# Patient Record
Sex: Male | Born: 1990 | Race: Black or African American | Hispanic: No | Marital: Married | Smoking: Former smoker
Health system: Southern US, Community
[De-identification: ages and names within clinical notes are randomized; demographics above are authoritative.]

## PROBLEM LIST (undated history)

## (undated) DIAGNOSIS — J45909 Unspecified asthma, uncomplicated: Secondary | ICD-10-CM

## (undated) DIAGNOSIS — F32A Depression, unspecified: Secondary | ICD-10-CM

## (undated) DIAGNOSIS — F411 Generalized anxiety disorder: Secondary | ICD-10-CM

---

## 2019-05-03 ENCOUNTER — Encounter (HOSPITAL_COMMUNITY): Payer: Self-pay | Admitting: Emergency Medicine

## 2019-05-03 ENCOUNTER — Emergency Department (HOSPITAL_COMMUNITY)
Admission: EM | Admit: 2019-05-03 | Discharge: 2019-05-03 | Discharge status: Against Medical Advice | Payer: Self-pay | Attending: Emergency Medicine | Admitting: Emergency Medicine

## 2019-05-03 ENCOUNTER — Other Ambulatory Visit: Payer: Self-pay

## 2019-05-03 DIAGNOSIS — R42 Dizziness and giddiness: Secondary | ICD-10-CM | POA: Insufficient documentation

## 2019-05-03 DIAGNOSIS — Z5321 Procedure and treatment not carried out due to patient leaving prior to being seen by health care provider: Secondary | ICD-10-CM | POA: Insufficient documentation

## 2019-05-03 LAB — BASIC METABOLIC PANEL
Anion gap: 9 (ref 5–15)
BUN: 9 mg/dL (ref 6–20)
CO2: 25 mmol/L (ref 22–32)
Calcium: 10.1 mg/dL (ref 8.9–10.3)
Chloride: 104 mmol/L (ref 98–111)
Creatinine, Ser: 0.84 mg/dL (ref 0.61–1.24)
GFR calc Af Amer: >60 mL/min (ref >60)
GFR calc non Af Amer: >60 mL/min (ref >60)
Glucose, Bld: 107 mg/dL — ABNORMAL HIGH (ref 70–99)
Potassium: 3.8 mmol/L (ref 3.5–5.1)
Sodium: 138 mmol/L (ref 135–145)

## 2019-05-03 LAB — CBC WITH DIFFERENTIAL/PLATELET
Abs Immature Granulocytes: 0.04 10*3/uL (ref 0.00–0.07)
Basophils Absolute: 0 10*3/uL (ref 0.0–0.1)
Basophils Relative: 0 %
Eosinophils Absolute: 0 10*3/uL (ref 0.0–0.5)
Eosinophils Relative: 0 %
HCT: 45.2 % (ref 39.0–52.0)
Hemoglobin: 15.3 g/dL (ref 13.0–17.0)
Immature Granulocytes: 0 %
Lymphocytes Relative: 12 %
Lymphs Abs: 1.1 10*3/uL (ref 0.7–4.0)
MCH: 31.5 pg (ref 26.0–34.0)
MCHC: 33.8 g/dL (ref 30.0–36.0)
MCV: 93.2 fL (ref 80.0–100.0)
Monocytes Absolute: 0.4 10*3/uL (ref 0.1–1.0)
Monocytes Relative: 4 %
Neutro Abs: 7.5 10*3/uL (ref 1.7–7.7)
Neutrophils Relative %: 84 %
Platelets: 286 10*3/uL (ref 150–400)
RBC: 4.85 MIL/uL (ref 4.22–5.81)
RDW: 12.8 % (ref 11.5–15.5)
WBC: 9.1 10*3/uL (ref 4.0–10.5)
nRBC: 0 % (ref 0.0–0.2)

## 2019-05-03 NOTE — ED Notes (Signed)
Pt with no answer for ready room x3.

## 2019-05-03 NOTE — ED Triage Notes (Signed)
Patient reports worsening migraine headache with dizziness and emesis onset last month unrelieved by OTC medications , denies head injury , no fever or chills .

## 2019-11-28 ENCOUNTER — Inpatient Hospital Stay (HOSPITAL_COMMUNITY)
Admission: EM | Admit: 2019-11-28 | Discharge: 2019-12-07 | DRG: 026 | Disposition: A | Discharge status: Home / Self Care | Payer: BC Managed Care – PPO | Attending: Neurosurgery | Admitting: Neurosurgery

## 2019-11-28 ENCOUNTER — Encounter (HOSPITAL_COMMUNITY): Payer: Self-pay | Admitting: Emergency Medicine

## 2019-11-28 ENCOUNTER — Other Ambulatory Visit: Payer: Self-pay

## 2019-11-28 DIAGNOSIS — G911 Obstructive hydrocephalus: Secondary | ICD-10-CM

## 2019-11-28 DIAGNOSIS — D431 Neoplasm of uncertain behavior of brain, infratentorial: Principal | ICD-10-CM | POA: Diagnosis present

## 2019-11-28 DIAGNOSIS — C716 Malignant neoplasm of cerebellum: Secondary | ICD-10-CM | POA: Diagnosis present

## 2019-11-28 DIAGNOSIS — D497 Neoplasm of unspecified behavior of endocrine glands and other parts of nervous system: Secondary | ICD-10-CM | POA: Diagnosis present

## 2019-11-28 DIAGNOSIS — Z20822 Contact with and (suspected) exposure to covid-19: Secondary | ICD-10-CM | POA: Diagnosis present

## 2019-11-28 DIAGNOSIS — R519 Headache, unspecified: Secondary | ICD-10-CM | POA: Diagnosis not present

## 2019-11-28 DIAGNOSIS — G9389 Other specified disorders of brain: Secondary | ICD-10-CM

## 2019-11-28 DIAGNOSIS — F17200 Nicotine dependence, unspecified, uncomplicated: Secondary | ICD-10-CM | POA: Diagnosis present

## 2019-11-28 LAB — DIFFERENTIAL
Abs Immature Granulocytes: 0.03 10*3/uL (ref 0.00–0.07)
Basophils Absolute: 0 10*3/uL (ref 0.0–0.1)
Basophils Relative: 1 %
Eosinophils Absolute: 0.1 10*3/uL (ref 0.0–0.5)
Eosinophils Relative: 1 %
Immature Granulocytes: 0 %
Lymphocytes Relative: 22 %
Lymphs Abs: 1.5 10*3/uL (ref 0.7–4.0)
Monocytes Absolute: 0.4 10*3/uL (ref 0.1–1.0)
Monocytes Relative: 7 %
Neutro Abs: 4.7 10*3/uL (ref 1.7–7.7)
Neutrophils Relative %: 69 %

## 2019-11-28 LAB — CBC
HCT: 46.7 % (ref 39.0–52.0)
Hemoglobin: 15.5 g/dL (ref 13.0–17.0)
MCH: 31.1 pg (ref 26.0–34.0)
MCHC: 33.2 g/dL (ref 30.0–36.0)
MCV: 93.6 fL (ref 80.0–100.0)
Platelets: 319 10*3/uL (ref 150–400)
RBC: 4.99 MIL/uL (ref 4.22–5.81)
RDW: 12.7 % (ref 11.5–15.5)
WBC: 6.8 10*3/uL (ref 4.0–10.5)
nRBC: 0 % (ref 0.0–0.2)

## 2019-11-28 LAB — I-STAT CHEM 8, ED
BUN: 10 mg/dL (ref 6–20)
Calcium, Ion: 1.23 mmol/L (ref 1.15–1.40)
Chloride: 103 mmol/L (ref 98–111)
Creatinine, Ser: 0.9 mg/dL (ref 0.61–1.24)
Glucose, Bld: 88 mg/dL (ref 70–99)
HCT: 46 % (ref 39.0–52.0)
Hemoglobin: 15.6 g/dL (ref 13.0–17.0)
Potassium: 4 mmol/L (ref 3.5–5.1)
Sodium: 141 mmol/L (ref 135–145)
TCO2: 29 mmol/L (ref 22–32)

## 2019-11-28 LAB — PROTIME-INR
INR: 1.1 (ref 0.8–1.2)
Prothrombin Time: 13.8 seconds (ref 11.4–15.2)

## 2019-11-28 LAB — APTT: aPTT: 28 seconds (ref 24–36)

## 2019-11-28 NOTE — ED Triage Notes (Signed)
Pt reports hx of migranes X3 weeks as well as vision changes over the past three weeks.  Pt did have problems w/ coordination.  PA Harris verbally ordered CT.

## 2019-11-29 ENCOUNTER — Encounter (HOSPITAL_COMMUNITY)
Admission: EM | Disposition: A | Discharge status: Home / Self Care | Payer: Self-pay | Source: Home / Self Care | Attending: Neurosurgery

## 2019-11-29 ENCOUNTER — Inpatient Hospital Stay (HOSPITAL_COMMUNITY): Payer: BC Managed Care – PPO | Admitting: Anesthesiology

## 2019-11-29 ENCOUNTER — Emergency Department (HOSPITAL_COMMUNITY): Payer: BC Managed Care – PPO

## 2019-11-29 ENCOUNTER — Encounter (HOSPITAL_COMMUNITY): Payer: Self-pay | Admitting: Neurosurgery

## 2019-11-29 ENCOUNTER — Inpatient Hospital Stay (HOSPITAL_COMMUNITY): Payer: BC Managed Care – PPO

## 2019-11-29 DIAGNOSIS — Z20822 Contact with and (suspected) exposure to covid-19: Secondary | ICD-10-CM | POA: Diagnosis present

## 2019-11-29 DIAGNOSIS — G911 Obstructive hydrocephalus: Secondary | ICD-10-CM | POA: Diagnosis present

## 2019-11-29 DIAGNOSIS — R519 Headache, unspecified: Secondary | ICD-10-CM | POA: Diagnosis present

## 2019-11-29 DIAGNOSIS — F17200 Nicotine dependence, unspecified, uncomplicated: Secondary | ICD-10-CM | POA: Diagnosis present

## 2019-11-29 DIAGNOSIS — D497 Neoplasm of unspecified behavior of endocrine glands and other parts of nervous system: Secondary | ICD-10-CM | POA: Diagnosis present

## 2019-11-29 DIAGNOSIS — C716 Malignant neoplasm of cerebellum: Secondary | ICD-10-CM | POA: Diagnosis present

## 2019-11-29 DIAGNOSIS — D431 Neoplasm of uncertain behavior of brain, infratentorial: Secondary | ICD-10-CM | POA: Diagnosis present

## 2019-11-29 HISTORY — PX: SUBOCCIPITAL CRANIECTOMY CERVICAL LAMINECTOMY: SHX5404

## 2019-11-29 LAB — RESPIRATORY PANEL BY RT PCR (FLU A&B, COVID)
Influenza A by PCR: NEGATIVE
Influenza B by PCR: NEGATIVE
SARS Coronavirus 2 by RT PCR: NEGATIVE

## 2019-11-29 LAB — CBC WITH DIFFERENTIAL/PLATELET
Abs Immature Granulocytes: 0.02 10*3/uL (ref 0.00–0.07)
Basophils Absolute: 0 10*3/uL (ref 0.0–0.1)
Basophils Relative: 1 %
Eosinophils Absolute: 0.1 10*3/uL (ref 0.0–0.5)
Eosinophils Relative: 2 %
HCT: 43.3 % (ref 39.0–52.0)
Hemoglobin: 14.5 g/dL (ref 13.0–17.0)
Immature Granulocytes: 0 %
Lymphocytes Relative: 32 %
Lymphs Abs: 2.1 10*3/uL (ref 0.7–4.0)
MCH: 31.8 pg (ref 26.0–34.0)
MCHC: 33.5 g/dL (ref 30.0–36.0)
MCV: 95 fL (ref 80.0–100.0)
Monocytes Absolute: 0.6 10*3/uL (ref 0.1–1.0)
Monocytes Relative: 9 %
Neutro Abs: 3.7 10*3/uL (ref 1.7–7.7)
Neutrophils Relative %: 56 %
Platelets: 315 10*3/uL (ref 150–400)
RBC: 4.56 MIL/uL (ref 4.22–5.81)
RDW: 12.9 % (ref 11.5–15.5)
WBC: 6.5 10*3/uL (ref 4.0–10.5)
nRBC: 0 % (ref 0.0–0.2)

## 2019-11-29 LAB — COMPREHENSIVE METABOLIC PANEL
ALT: 13 U/L (ref 0–44)
AST: 15 U/L (ref 15–41)
Albumin: 4.4 g/dL (ref 3.5–5.0)
Alkaline Phosphatase: 68 U/L (ref 38–126)
Anion gap: 12 (ref 5–15)
BUN: 10 mg/dL (ref 6–20)
CO2: 27 mmol/L (ref 22–32)
Calcium: 10.2 mg/dL (ref 8.9–10.3)
Chloride: 103 mmol/L (ref 98–111)
Creatinine, Ser: 0.92 mg/dL (ref 0.61–1.24)
GFR calc Af Amer: >60 mL/min (ref >60)
GFR calc non Af Amer: >60 mL/min (ref >60)
Glucose, Bld: 97 mg/dL (ref 70–99)
Potassium: 4.1 mmol/L (ref 3.5–5.1)
Sodium: 142 mmol/L (ref 135–145)
Total Bilirubin: 1 mg/dL (ref 0.3–1.2)
Total Protein: 7.6 g/dL (ref 6.5–8.1)

## 2019-11-29 LAB — APTT: aPTT: 28 seconds (ref 24–36)

## 2019-11-29 LAB — SURGICAL PCR SCREEN
MRSA, PCR: POSITIVE — AB
Staphylococcus aureus: POSITIVE — AB

## 2019-11-29 LAB — TYPE AND SCREEN
ABO/RH(D): O POS
Antibody Screen: NEGATIVE

## 2019-11-29 LAB — HIV ANTIBODY (ROUTINE TESTING W REFLEX): HIV Screen 4th Generation wRfx: NONREACTIVE

## 2019-11-29 LAB — CREATININE, SERUM
Creatinine, Ser: 0.83 mg/dL (ref 0.61–1.24)
GFR calc Af Amer: >60 mL/min (ref >60)
GFR calc non Af Amer: >60 mL/min (ref >60)

## 2019-11-29 LAB — PROTIME-INR
INR: 1.1 (ref 0.8–1.2)
Prothrombin Time: 13.5 seconds (ref 11.4–15.2)

## 2019-11-29 LAB — ABO/RH: ABO/RH(D): O POS

## 2019-11-29 SURGERY — SUBOCCIPITAL CRANIECTOMY CERVICAL LAMINECTOMY/DURAPLASTY
Anesthesia: General | Site: Neck

## 2019-11-29 MED ORDER — FENTANYL CITRATE (PF) 100 MCG/2ML IJ SOLN
INTRAMUSCULAR | Status: AC
  Filled 2019-11-29: qty 2

## 2019-11-29 MED ORDER — SUCCINYLCHOLINE CHLORIDE 200 MG/10ML IV SOSY
PREFILLED_SYRINGE | INTRAVENOUS | Status: AC
  Filled 2019-11-29: qty 10

## 2019-11-29 MED ORDER — MUPIROCIN 2 % EX OINT
1.0000 "application " | TOPICAL_OINTMENT | Freq: Two times a day (BID) | CUTANEOUS | Status: AC
  Administered 2019-11-29 – 2019-12-03 (×10): 1 via NASAL
  Filled 2019-11-29 (×3): qty 22

## 2019-11-29 MED ORDER — MORPHINE SULFATE (PF) 2 MG/ML IV SOLN
2.0000 mg | INTRAVENOUS | Status: DC | PRN
  Administered 2019-11-29 – 2019-12-06 (×24): 2 mg via INTRAVENOUS
  Filled 2019-11-29 (×24): qty 1

## 2019-11-29 MED ORDER — 0.9 % SODIUM CHLORIDE (POUR BTL) OPTIME
TOPICAL | Status: DC | PRN
  Administered 2019-11-29 (×3): 1000 mL

## 2019-11-29 MED ORDER — SODIUM CHLORIDE 0.9 % IV SOLN: INTRAVENOUS | Status: DC | PRN

## 2019-11-29 MED ORDER — CHLORHEXIDINE GLUCONATE CLOTH 2 % EX PADS
6.0000 | MEDICATED_PAD | Freq: Every day | CUTANEOUS | Status: DC
Start: 2019-11-29 — End: 2019-11-29

## 2019-11-29 MED ORDER — FENTANYL CITRATE (PF) 250 MCG/5ML IJ SOLN
INTRAMUSCULAR | Status: AC
  Filled 2019-11-29: qty 5

## 2019-11-29 MED ORDER — MENTHOL 3 MG MT LOZG
1.0000 | LOZENGE | OROMUCOSAL | Status: DC | PRN
  Administered 2019-11-29 – 2019-11-30 (×3): 3 mg via ORAL
  Filled 2019-11-29: qty 9

## 2019-11-29 MED ORDER — NALOXONE HCL 0.4 MG/ML IJ SOLN: 0.0800 mg | INTRAMUSCULAR | Status: DC | PRN

## 2019-11-29 MED ORDER — ACETAMINOPHEN 650 MG RE SUPP: 650.0000 mg | Freq: Four times a day (QID) | RECTAL | Status: DC | PRN

## 2019-11-29 MED ORDER — ONDANSETRON HCL 4 MG PO TABS: 4.0000 mg | ORAL_TABLET | Freq: Four times a day (QID) | ORAL | Status: DC | PRN

## 2019-11-29 MED ORDER — SODIUM CHLORIDE 0.9 % IV SOLN
INTRAVENOUS | Status: AC | PRN
  Administered 2019-11-29: 1000 mL via INTRAMUSCULAR

## 2019-11-29 MED ORDER — HEMOSTATIC AGENTS (NO CHARGE) OPTIME
TOPICAL | Status: DC | PRN
  Administered 2019-11-29: 1 via TOPICAL

## 2019-11-29 MED ORDER — THROMBIN 5000 UNITS EX SOLR
CUTANEOUS | Status: AC
  Filled 2019-11-29: qty 5000

## 2019-11-29 MED ORDER — CEFAZOLIN SODIUM-DEXTROSE 2-3 GM-%(50ML) IV SOLR
INTRAVENOUS | Status: DC | PRN
  Administered 2019-11-29: 2 g via INTRAVENOUS

## 2019-11-29 MED ORDER — SENNOSIDES-DOCUSATE SODIUM 8.6-50 MG PO TABS
1.0000 | ORAL_TABLET | Freq: Every evening | ORAL | Status: DC | PRN
  Administered 2019-12-04: 1 via ORAL
  Filled 2019-11-29: qty 1

## 2019-11-29 MED ORDER — THROMBIN 20000 UNITS EX SOLR
CUTANEOUS | Status: AC
  Filled 2019-11-29: qty 20000

## 2019-11-29 MED ORDER — LEVETIRACETAM IN NACL 500 MG/100ML IV SOLN
500.0000 mg | Freq: Two times a day (BID) | INTRAVENOUS | Status: DC
  Administered 2019-11-29 – 2019-12-01 (×4): 500 mg via INTRAVENOUS
  Filled 2019-11-29 (×4): qty 100

## 2019-11-29 MED ORDER — PROMETHAZINE HCL 25 MG/ML IJ SOLN
12.5000 mg | Freq: Four times a day (QID) | INTRAMUSCULAR | Status: DC | PRN
  Filled 2019-11-29: qty 1

## 2019-11-29 MED ORDER — PROMETHAZINE HCL 25 MG PO TABS: 12.5000 mg | ORAL_TABLET | ORAL | Status: DC | PRN

## 2019-11-29 MED ORDER — ROCURONIUM BROMIDE 10 MG/ML (PF) SYRINGE
PREFILLED_SYRINGE | INTRAVENOUS | Status: DC | PRN
  Administered 2019-11-29: 50 mg via INTRAVENOUS
  Administered 2019-11-29: 100 mg via INTRAVENOUS

## 2019-11-29 MED ORDER — FENTANYL CITRATE (PF) 250 MCG/5ML IJ SOLN
INTRAMUSCULAR | Status: DC | PRN
  Administered 2019-11-29 (×3): 50 ug via INTRAVENOUS
  Administered 2019-11-29: 150 ug via INTRAVENOUS

## 2019-11-29 MED ORDER — CHLORHEXIDINE GLUCONATE CLOTH 2 % EX PADS
6.0000 | MEDICATED_PAD | Freq: Every day | CUTANEOUS | Status: DC
  Administered 2019-11-29: 6 via TOPICAL

## 2019-11-29 MED ORDER — ONDANSETRON HCL 4 MG/2ML IJ SOLN
INTRAMUSCULAR | Status: DC | PRN
  Administered 2019-11-29: 4 mg via INTRAVENOUS

## 2019-11-29 MED ORDER — CEFAZOLIN SODIUM-DEXTROSE 1-4 GM/50ML-% IV SOLN
1.0000 g | Freq: Three times a day (TID) | INTRAVENOUS | Status: DC
  Administered 2019-11-29 – 2019-12-07 (×24): 1 g via INTRAVENOUS
  Filled 2019-11-29 (×26): qty 50

## 2019-11-29 MED ORDER — LIDOCAINE-EPINEPHRINE 0.5 %-1:200000 IJ SOLN
INTRAMUSCULAR | Status: AC
  Filled 2019-11-29: qty 1

## 2019-11-29 MED ORDER — BACITRACIN ZINC 500 UNIT/GM EX OINT
TOPICAL_OINTMENT | CUTANEOUS | Status: AC
  Filled 2019-11-29: qty 28.35

## 2019-11-29 MED ORDER — DEXMEDETOMIDINE HCL 200 MCG/2ML IV SOLN
INTRAVENOUS | Status: DC | PRN
  Administered 2019-11-29 (×2): 20 ug via INTRAVENOUS

## 2019-11-29 MED ORDER — SODIUM CHLORIDE 0.9 % IV SOLN
250.0000 mL | INTRAVENOUS | Status: DC | PRN
  Administered 2019-12-06: 250 mL via INTRAVENOUS

## 2019-11-29 MED ORDER — ACETAMINOPHEN 325 MG PO TABS
650.0000 mg | ORAL_TABLET | Freq: Four times a day (QID) | ORAL | Status: DC | PRN
  Administered 2019-11-30 – 2019-12-01 (×2): 650 mg via ORAL
  Filled 2019-11-29 (×3): qty 2

## 2019-11-29 MED ORDER — FLEET ENEMA 7-19 GM/118ML RE ENEM: 1.0000 | ENEMA | Freq: Once | RECTAL | Status: DC | PRN

## 2019-11-29 MED ORDER — SODIUM CHLORIDE 0.9% FLUSH
3.0000 mL | Freq: Two times a day (BID) | INTRAVENOUS | Status: DC
  Administered 2019-11-29 – 2019-12-07 (×15): 3 mL via INTRAVENOUS

## 2019-11-29 MED ORDER — HYDROCODONE-ACETAMINOPHEN 5-325 MG PO TABS
1.0000 | ORAL_TABLET | ORAL | Status: DC | PRN
  Administered 2019-11-30 (×3): 1 via ORAL
  Administered 2019-12-01 (×2): 2 via ORAL
  Administered 2019-12-01: 1 via ORAL
  Administered 2019-12-01: 2 via ORAL
  Administered 2019-12-02 (×2): 1 via ORAL
  Administered 2019-12-03 – 2019-12-07 (×21): 2 via ORAL
  Filled 2019-11-29 (×2): qty 1
  Filled 2019-11-29 (×4): qty 2
  Filled 2019-11-29: qty 1
  Filled 2019-11-29 (×2): qty 2
  Filled 2019-11-29: qty 1
  Filled 2019-11-29 (×10): qty 2
  Filled 2019-11-29: qty 1
  Filled 2019-11-29 (×3): qty 2
  Filled 2019-11-29: qty 1
  Filled 2019-11-29 (×6): qty 2

## 2019-11-29 MED ORDER — IOHEXOL 300 MG/ML  SOLN
75.0000 mL | Freq: Once | INTRAMUSCULAR | Status: AC | PRN
  Administered 2019-11-29: 13:00:00 75 mL via INTRAVENOUS

## 2019-11-29 MED ORDER — ONDANSETRON HCL 4 MG/2ML IJ SOLN
4.0000 mg | Freq: Four times a day (QID) | INTRAMUSCULAR | Status: DC | PRN
  Administered 2019-11-29 – 2019-12-02 (×5): 4 mg via INTRAVENOUS
  Filled 2019-11-29 (×5): qty 2

## 2019-11-29 MED ORDER — PHENYLEPHRINE HCL-NACL 10-0.9 MG/250ML-% IV SOLN
INTRAVENOUS | Status: DC | PRN
  Administered 2019-11-29: 15 ug/min via INTRAVENOUS

## 2019-11-29 MED ORDER — PROPOFOL 10 MG/ML IV BOLUS
INTRAVENOUS | Status: DC | PRN
  Administered 2019-11-29: 140 mg via INTRAVENOUS
  Administered 2019-11-29 (×2): 40 mg via INTRAVENOUS
  Administered 2019-11-29: 60 mg via INTRAVENOUS

## 2019-11-29 MED ORDER — LIDOCAINE 2% (20 MG/ML) 5 ML SYRINGE
INTRAMUSCULAR | Status: AC
  Filled 2019-11-29: qty 5

## 2019-11-29 MED ORDER — POTASSIUM CHLORIDE IN NACL 20-0.9 MEQ/L-% IV SOLN
INTRAVENOUS | Status: DC
  Filled 2019-11-29 (×3): qty 1000

## 2019-11-29 MED ORDER — THROMBIN 20000 UNITS EX SOLR: CUTANEOUS | Status: DC | PRN

## 2019-11-29 MED ORDER — LABETALOL HCL 5 MG/ML IV SOLN: 10.0000 mg | INTRAVENOUS | Status: DC | PRN

## 2019-11-29 MED ORDER — HEPARIN SODIUM (PORCINE) 5000 UNIT/ML IJ SOLN
5000.0000 [IU] | Freq: Three times a day (TID) | INTRAMUSCULAR | Status: DC
  Administered 2019-11-30 – 2019-12-07 (×23): 5000 [IU] via SUBCUTANEOUS
  Filled 2019-11-29 (×23): qty 1

## 2019-11-29 MED ORDER — DEXAMETHASONE SODIUM PHOSPHATE 10 MG/ML IJ SOLN
6.0000 mg | Freq: Four times a day (QID) | INTRAMUSCULAR | Status: DC
  Administered 2019-11-29 (×2): 6 mg via INTRAVENOUS
  Administered 2019-11-29: 10 mg via INTRAVENOUS
  Administered 2019-11-30 (×4): 6 mg via INTRAVENOUS
  Filled 2019-11-29 (×7): qty 1

## 2019-11-29 MED ORDER — PANTOPRAZOLE SODIUM 40 MG IV SOLR
40.0000 mg | Freq: Every day | INTRAVENOUS | Status: DC
  Administered 2019-11-30: 22:00:00 40 mg via INTRAVENOUS
  Filled 2019-11-29 (×2): qty 40

## 2019-11-29 MED ORDER — ONDANSETRON HCL 4 MG/2ML IJ SOLN
INTRAMUSCULAR | Status: AC
  Filled 2019-11-29: qty 2

## 2019-11-29 MED ORDER — SUGAMMADEX SODIUM 200 MG/2ML IV SOLN
INTRAVENOUS | Status: DC | PRN
  Administered 2019-11-29: 400 mg via INTRAVENOUS

## 2019-11-29 MED ORDER — SODIUM CHLORIDE 0.9 % IV SOLN
0.3000 ug/kg/min | INTRAVENOUS | Status: DC
  Filled 2019-11-29 (×5): qty 5000

## 2019-11-29 MED ORDER — GADOBUTROL 1 MMOL/ML IV SOLN
9.7000 mL | Freq: Once | INTRAVENOUS | Status: AC | PRN
  Administered 2019-11-29: 03:00:00 9.7 mL via INTRAVENOUS

## 2019-11-29 MED ORDER — MANNITOL 25 % IV SOLN
INTRAVENOUS | Status: DC | PRN
  Administered 2019-11-29: 40.5 g via INTRAVENOUS

## 2019-11-29 MED ORDER — SODIUM CHLORIDE 0.9% FLUSH: 3.0000 mL | INTRAVENOUS | Status: DC | PRN

## 2019-11-29 MED ORDER — THROMBIN 5000 UNITS EX SOLR: OROMUCOSAL | Status: DC | PRN

## 2019-11-29 MED ORDER — PROPOFOL 10 MG/ML IV BOLUS
INTRAVENOUS | Status: AC
  Filled 2019-11-29: qty 20

## 2019-11-29 MED ORDER — DEXAMETHASONE SODIUM PHOSPHATE 10 MG/ML IJ SOLN
10.0000 mg | Freq: Once | INTRAMUSCULAR | Status: AC
  Administered 2019-11-29: 05:00:00 10 mg via INTRAVENOUS
  Filled 2019-11-29: qty 1

## 2019-11-29 MED ORDER — SODIUM CHLORIDE 0.9 % IV SOLN: INTRAVENOUS | Status: DC

## 2019-11-29 MED ORDER — LIDOCAINE 2% (20 MG/ML) 5 ML SYRINGE
INTRAMUSCULAR | Status: DC | PRN
  Administered 2019-11-29: 60 mg via INTRAVENOUS

## 2019-11-29 MED ORDER — FENTANYL CITRATE (PF) 100 MCG/2ML IJ SOLN
25.0000 ug | INTRAMUSCULAR | Status: DC | PRN
  Administered 2019-11-29: 50 ug via INTRAVENOUS

## 2019-11-29 MED ORDER — BACITRACIN ZINC 500 UNIT/GM EX OINT
TOPICAL_OINTMENT | CUTANEOUS | Status: DC | PRN
  Administered 2019-11-29 (×2): 1 via TOPICAL

## 2019-11-29 MED ORDER — BISACODYL 5 MG PO TBEC
5.0000 mg | DELAYED_RELEASE_TABLET | Freq: Every day | ORAL | Status: DC | PRN
  Administered 2019-12-04 – 2019-12-07 (×3): 5 mg via ORAL
  Filled 2019-11-29 (×3): qty 1

## 2019-11-29 MED ORDER — CEFAZOLIN SODIUM-DEXTROSE 2-4 GM/100ML-% IV SOLN
INTRAVENOUS | Status: AC
  Filled 2019-11-29: qty 100

## 2019-11-29 MED ORDER — ESMOLOL HCL 100 MG/10ML IV SOLN
INTRAVENOUS | Status: DC | PRN
  Administered 2019-11-29: 20 mg via INTRAVENOUS
  Administered 2019-11-29: 70 mg via INTRAVENOUS
  Administered 2019-11-29: 20 mg via INTRAVENOUS
  Administered 2019-11-29: 40 mg via INTRAVENOUS
  Administered 2019-11-29: 30 mg via INTRAVENOUS
  Administered 2019-11-29: 40 mg via INTRAVENOUS

## 2019-11-29 MED ORDER — ESMOLOL HCL 100 MG/10ML IV SOLN
INTRAVENOUS | Status: AC
  Filled 2019-11-29: qty 20

## 2019-11-29 MED ORDER — DEXAMETHASONE SODIUM PHOSPHATE 10 MG/ML IJ SOLN
INTRAMUSCULAR | Status: AC
  Filled 2019-11-29: qty 1

## 2019-11-29 MED ORDER — SODIUM CHLORIDE 0.9 % IV SOLN
0.0125 ug/kg/min | INTRAVENOUS | Status: AC
  Administered 2019-11-29: .3 ug/kg/min via INTRAVENOUS
  Administered 2019-11-29: .2 ug/kg/min via INTRAVENOUS
  Filled 2019-11-29: qty 2000

## 2019-11-29 MED ORDER — LIDOCAINE-EPINEPHRINE 0.5 %-1:200000 IJ SOLN
INTRAMUSCULAR | Status: DC | PRN
  Administered 2019-11-29: 10 mL

## 2019-11-29 MED ORDER — POTASSIUM CHLORIDE IN NACL 20-0.9 MEQ/L-% IV SOLN
INTRAVENOUS | Status: DC
  Filled 2019-11-29 (×2): qty 1000

## 2019-11-29 SURGICAL SUPPLY — 104 items
BENZOIN TINCTURE PRP APPL 2/3 (GAUZE/BANDAGES/DRESSINGS) IMPLANT
BLADE CLIPPER SURG (BLADE) ×3 IMPLANT
BLADE ULTRA TIP 2M (BLADE) IMPLANT
BUR ACORN 6.0 PRECISION (BURR) IMPLANT
BUR ACORN 6.0MM PRECISION (BURR)
BUR ACORN 9.0 PRECISION (BURR) ×2 IMPLANT
BUR ACORN 9.0MM PRECISION (BURR) ×1
BUR SPIRAL ROUTER 2.3 (BUR) ×2 IMPLANT
BUR SPIRAL ROUTER 2.3MM (BUR) ×1
BUR TAPERED ROUTER 3.0 (BURR) IMPLANT
CABLE BIPOLOR RESECTION CORD (MISCELLANEOUS) IMPLANT
CANISTER SUCT 3000ML PPV (MISCELLANEOUS) ×6 IMPLANT
CARTRIDGE OIL MAESTRO DRILL (MISCELLANEOUS) IMPLANT
CATH VENTRIC 35X38 W/TROCAR LG (CATHETERS) ×3 IMPLANT
CATH VENTRICULAR ANTIMICRO (CATHETERS) IMPLANT
CATHETER VENTRICULAR ANTIMICRO (CATHETERS)
CLIP VESOCCLUDE MED 6/CT (CLIP) ×6 IMPLANT
COVER BACK TABLE 60X90IN (DRAPES) IMPLANT
COVER BURR HOLE UNIV 10 (Orthopedic Implant) ×6 IMPLANT
COVER WAND RF STERILE (DRAPES) IMPLANT
DECANTER SPIKE VIAL GLASS SM (MISCELLANEOUS) ×3 IMPLANT
DIFFUSER DRILL AIR PNEUMATIC (MISCELLANEOUS) IMPLANT
DRAPE LAPAROTOMY 100X72 PEDS (DRAPES) ×3 IMPLANT
DRAPE MICROSCOPE LEICA (MISCELLANEOUS) ×3 IMPLANT
DRAPE WARM FLUID 44X44 (DRAPES) ×3 IMPLANT
DRSG OPSITE 4X5.5 SM (GAUZE/BANDAGES/DRESSINGS) ×3 IMPLANT
DRSG OPSITE POSTOP 3X4 (GAUZE/BANDAGES/DRESSINGS) ×3 IMPLANT
DRSG OPSITE POSTOP 4X6 (GAUZE/BANDAGES/DRESSINGS) ×3 IMPLANT
DURAGUARD 04CMX04CM ×3 IMPLANT
DURAPREP 26ML APPLICATOR (WOUND CARE) ×3 IMPLANT
DURAPREP 6ML APPLICATOR 50/CS (WOUND CARE) IMPLANT
ELECT CAUTERY BLADE 6.4 (BLADE) IMPLANT
ELECT REM PT RETURN 9FT ADLT (ELECTROSURGICAL) ×3
ELECTRODE REM PT RTRN 9FT ADLT (ELECTROSURGICAL) ×1 IMPLANT
EVACUATOR 1/8 PVC DRAIN (DRAIN) IMPLANT
EVACUATOR SILICONE 100CC (DRAIN) IMPLANT
FORCEPS BIPO MALIS IRRIG 9X1.5 (NEUROSURGERY SUPPLIES) ×6 IMPLANT
FORCEPS BIPOLAR SPETZLER 8 1.0 (NEUROSURGERY SUPPLIES) IMPLANT
GAUZE 4X4 16PLY RFD (DISPOSABLE) IMPLANT
GAUZE SPONGE 4X4 12PLY STRL (GAUZE/BANDAGES/DRESSINGS) IMPLANT
GLOVE BIO SURGEON STRL SZ 6.5 (GLOVE) ×4 IMPLANT
GLOVE BIO SURGEON STRL SZ7 (GLOVE) ×3 IMPLANT
GLOVE BIO SURGEON STRL SZ7.5 (GLOVE) ×3 IMPLANT
GLOVE BIO SURGEON STRL SZ8 (GLOVE) IMPLANT
GLOVE BIO SURGEON STRL SZ8.5 (GLOVE) IMPLANT
GLOVE BIO SURGEONS STRL SZ 6.5 (GLOVE) ×2
GLOVE BIOGEL M 8.0 STRL (GLOVE) IMPLANT
GLOVE BIOGEL PI IND STRL 6.5 (GLOVE) ×1 IMPLANT
GLOVE BIOGEL PI IND STRL 7.0 (GLOVE) ×1 IMPLANT
GLOVE BIOGEL PI INDICATOR 6.5 (GLOVE) ×2
GLOVE BIOGEL PI INDICATOR 7.0 (GLOVE) ×2
GLOVE ECLIPSE 6.5 STRL STRAW (GLOVE) ×3 IMPLANT
GLOVE ECLIPSE 7.0 STRL STRAW (GLOVE) IMPLANT
GLOVE ECLIPSE 7.5 STRL STRAW (GLOVE) ×6 IMPLANT
GLOVE ECLIPSE 8.0 STRL XLNG CF (GLOVE) IMPLANT
GLOVE ECLIPSE 8.5 STRL (GLOVE) IMPLANT
GLOVE EXAM NITRILE XL STR (GLOVE) IMPLANT
GLOVE INDICATOR 6.5 STRL GRN (GLOVE) ×3 IMPLANT
GLOVE INDICATOR 7.0 STRL GRN (GLOVE) ×3 IMPLANT
GLOVE INDICATOR 7.5 STRL GRN (GLOVE) ×12 IMPLANT
GLOVE INDICATOR 8.0 STRL GRN (GLOVE) IMPLANT
GLOVE INDICATOR 8.5 STRL (GLOVE) IMPLANT
GLOVE OPTIFIT SS 8.0 STRL (GLOVE) IMPLANT
GLOVE SURG SS PI 6.5 STRL IVOR (GLOVE) ×3 IMPLANT
GLOVE SURG SS PI 7.0 STRL IVOR (GLOVE) ×9 IMPLANT
GOWN STRL REUS W/ TWL LRG LVL3 (GOWN DISPOSABLE) ×3 IMPLANT
GOWN STRL REUS W/ TWL XL LVL3 (GOWN DISPOSABLE) ×2 IMPLANT
GOWN STRL REUS W/TWL 2XL LVL3 (GOWN DISPOSABLE) IMPLANT
GOWN STRL REUS W/TWL LRG LVL3 (GOWN DISPOSABLE) ×6
GOWN STRL REUS W/TWL XL LVL3 (GOWN DISPOSABLE) ×4
HEMOSTAT POWDER KIT SURGIFOAM (HEMOSTASIS) ×6 IMPLANT
HEMOSTAT SURGICEL 2X14 (HEMOSTASIS) IMPLANT
IV NS 1000ML (IV SOLUTION) ×2
IV NS 1000ML BAXH (IV SOLUTION) ×1 IMPLANT
KIT BASIN OR (CUSTOM PROCEDURE TRAY) ×3 IMPLANT
KIT DRAIN CSF ACCUDRAIN (MISCELLANEOUS) ×3 IMPLANT
KIT TURNOVER KIT B (KITS) ×3 IMPLANT
NEEDLE HYPO 25X1 1.5 SAFETY (NEEDLE) ×3 IMPLANT
NS IRRIG 1000ML POUR BTL (IV SOLUTION) ×9 IMPLANT
OIL CARTRIDGE MAESTRO DRILL (MISCELLANEOUS)
PACK BATTERY CMF DISP FOR DVR (ORTHOPEDIC DISPOSABLE SUPPLIES) ×3 IMPLANT
PACK CRANIOTOMY CUSTOM (CUSTOM PROCEDURE TRAY) ×3 IMPLANT
PAD ARMBOARD 7.5X6 YLW CONV (MISCELLANEOUS) IMPLANT
PATTIES SURGICAL .5 X1 (DISPOSABLE) IMPLANT
PATTIES SURGICAL 1/4 X 3 (GAUZE/BANDAGES/DRESSINGS) IMPLANT
RUBBERBAND STERILE (MISCELLANEOUS) ×6 IMPLANT
SCREW UNIII AXS SD 1.5X4 (Screw) ×24 IMPLANT
SEALANT ADHERUS EXTEND TIP (MISCELLANEOUS) ×3 IMPLANT
SET TUBING IRRIGATION DISP (TUBING) ×6 IMPLANT
SPONGE LAP 4X18 RFD (DISPOSABLE) IMPLANT
SPONGE SURGIFOAM ABS GEL 100 (HEMOSTASIS) ×3 IMPLANT
STAPLER SKIN PROX WIDE 3.9 (STAPLE) ×3 IMPLANT
SUT ETHILON 3 0 FSL (SUTURE) IMPLANT
SUT NURALON 4 0 TR CR/8 (SUTURE) ×6 IMPLANT
SUT VIC AB 0 CT1 18XCR BRD8 (SUTURE) ×2 IMPLANT
SUT VIC AB 0 CT1 8-18 (SUTURE) ×4
SUT VIC AB 2-0 CT2 18 VCP726D (SUTURE) ×3 IMPLANT
SUT VIC AB 3-0 SH 8-18 (SUTURE) ×3 IMPLANT
SYR CONTROL 10ML LL (SYRINGE) ×3 IMPLANT
TOWEL GREEN STERILE (TOWEL DISPOSABLE) ×3 IMPLANT
TOWEL GREEN STERILE FF (TOWEL DISPOSABLE) IMPLANT
TRAY FOLEY MTR SLVR 14FR STAT (SET/KITS/TRAYS/PACK) ×3 IMPLANT
UNDERPAD 30X30 (UNDERPADS AND DIAPERS) IMPLANT
WATER STERILE IRR 1000ML POUR (IV SOLUTION) ×3 IMPLANT

## 2019-11-29 NOTE — Op Note (Signed)
11/29/2019  7:02 PM  PATIENT:  Tommy Russell  29 y.o. male  PRE-OPERATIVE DIAGNOSIS:  CEREBELLAR MASS  POST-OPERATIVE DIAGNOSIS:  CEREBELLAR MASS  PROCEDURE:  Procedure(s):frameless stereotactic guided craniotomy SUBOCCIPITAL CRANIECTOMY CERVICAL LAMINECTOMY/DURAPLASTY FOR RESECTION OF MASSS AND PLACEMENT OF EXTERNAL VENTRICULAR DRAIN   SURGEON: Surgeon(s): Vallarie Mare, MD Ashok Pall, MD  ASSISTANTS: Duffy Rhody  ANESTHESIA:   general  EBL:  No intake/output data recorded.  BLOOD ADMINISTERED:none  CELL SAVER GIVEN:none  COUNT:per nursing  DRAINS: ventricular catheter in right lateral ventricle   SPECIMEN:  Source of Specimen:  cerebellum  DICTATION: Tommy Russell was taken to the operating room, intubated, and placed under a general anesthetic without difficulty. A foley catheter was placed under sterile conditions. A Mayfield head holder was placed once adequate anesthesia was obtained. He was positioned prone on the operating table on flat rolls with his head attached via an adapter and secured to the bed. Chin was flexed and he was placed his head looking straight down. All pressure points were properly padded. His head was then localized to the preop CT scan using the brain lab system. Once registration was completed his head was shaved, was prepped and he was  draped in a sterile manner.  A burr hole was created to place a ventricular catheter into the lateral ventricle on the right. The catheter was placed with the use of the stereotactic guidance.With good flow of clear fluid the catheter was tunneled and secured to the scalp with sutures. It was opened intermittently during the operation.  With stereotactic guidance the torcula and transverse sinuses were marked as the superior portion of the craniotomy. The scalp was incised with a 10 blade in a linear fashion. The incision with cautery was advanced to the skull using monopolar cautery. Once more location  was checked with stereotaxis. With a drill two burr holes were created in the occiput. A bone flap was turned using the craniotome. We checked landmarks once more and proceeded to open the dura. The cyst was readily appreciated and what we suspected was the mural nodule was identified. We opened the cyst and carefully removed the nodule we believed in  Total. We irrigated, secured hemostasis, then closed the wound. Dura was closed with a dural substituted sewn in as a patch. The bone flap was approximated with plates and screws. The incision was closed in layers using suture(vicryl) and the scalp edges approximated with staples. A sterile dressing was applied, and the catheter connected to a sterile closed drainage system. His head was removed from the bed, then rolled onto a hospital bed. The Mayfield was removed and he was taken to the PACU  PLAN OF CARE: Admit to inpatient   PATIENT DISPOSITION:  PACU - hemodynamically stable.   Delay start of Pharmacological VTE agent (>24hrs) due to surgical blood loss or risk of bleeding:  yes

## 2019-11-29 NOTE — Progress Notes (Signed)
Upon leveling and opening drain, patient had immediate output of 20cc.  Per orders to notify MD for drainage greater than 20cc in 5 minutes, Dr. Marcello Moores was paged.  He recommended drain be opened back up, but to notify him for drainage greater than 75cc in 1 hour.  Will continue to monitor.

## 2019-11-29 NOTE — ED Provider Notes (Signed)
[facility] EMERGENCY DEPARTMENT Provider Note   CSN: [id number] Arrival date & time: 11/28/19  2036     History Chief Complaint  Patient presents with  . Headache  . vision changes    Tommy Russell is a 29 y.o. male.  The history is provided by the patient.  Headache Pain location:  Generalized Onset quality:  Gradual Duration: several months. Timing:  Constant Progression:  Worsening Chronicity:  Chronic Relieved by:  Nothing Worsened by:  Nothing Associated symptoms: blurred vision, loss of balance, nausea, visual change and vomiting   Associated symptoms: no fever and no focal weakness   Patient presents with headache and visual changes.  Patient reports he has had what he felt was a migraine headache for several months.  He usually has it every day.  He was seen once in urgent care and was giving abortive migraine treatment.  However he continues to have headaches.  Over the past 3 weeks he has noted blurred vision, and the past week he has had worsening vision changes in the right eye.  He reports his vision is dark at times in the right eye.  He also reports he feels that his coordination is different.  He will have vomiting at times with his headache.  No fevers.  No focal weakness.  No chest pain or shortness of breath      PMH-none Social History   Tobacco Use  . Smoking status: Current Every Day Smoker  . Smokeless tobacco: Never Used  Substance Use Topics  . Alcohol use: Yes  . Drug use: Never    Home Medications Prior to Admission medications   Not on File    Allergies    Patient has no known allergies.  Review of Systems   Review of Systems  Constitutional: Negative for fever.  Eyes: Positive for blurred vision and visual disturbance.  Respiratory: Negative for shortness of breath.   Cardiovascular: Negative for chest pain.  Gastrointestinal: Positive for nausea and vomiting.  Neurological: Positive for headaches and loss of  balance. Negative for focal weakness.  All other systems reviewed and are negative.   Physical Exam Updated Vital Signs BP 128/76 (BP Location: Right Arm)   Pulse 100   Temp 98.4 F (36.9 C) (Oral)   Resp 16   SpO2 100%   Physical Exam  CONSTITUTIONAL: Well developed/well nourished HEAD: Normocephalic/atraumatic EYES: EOMI/PERRL, no nystagmus ENMT: Mucous membranes moist NECK: supple no meningeal signs SPINE/BACK:entire spine nontender CV: S1/S2 noted, no murmurs/rubs/gallops noted LUNGS: Lungs are clear to auscultation bilaterally, no apparent distress ABDOMEN: soft, nontender NEURO: Pt is awake/alert/appropriate, moves all extremitiesx4.  No facial droop.  No arm or leg drift.  EXTREMITIES: pulses normal/equal, full ROM SKIN: warm, color normal PSYCH: no abnormalities of mood noted, alert and oriented to situation  ED Results / Procedures / Treatments   Labs (all labs ordered are listed, but only abnormal results are displayed) Labs Reviewed  RESPIRATORY PANEL BY RT PCR (FLU A&B, COVID)  PROTIME-INR  APTT  CBC  DIFFERENTIAL  COMPREHENSIVE METABOLIC PANEL  I-STAT CHEM 8, ED    EKG EKG Interpretation  Date/Time:  Thursday November 29 2019 04:45:21 EDT Ventricular Rate:  86 PR Interval:    QRS Duration: 100 QT Interval:  351 QTC Calculation: 418 R Axis:   58 Text Interpretation: Sinus rhythm Probable left ventricular hypertrophy Baseline wander in lead(s) V4 Partial missing lead(s): V4 Interpretation limited secondary to artifact No previous ECGs available Confirmed by Christy Gentles,  Elenore Rota [phone]) on 11/29/2019 4:50:22 AM   Radiology CT HEAD WO CONTRAST  Result Date: 11/29/2019 CLINICAL DATA:  Dizziness and loss of vision with onset 2 weeks ago. EXAM: CT HEAD WITHOUT CONTRAST TECHNIQUE: Contiguous axial images were obtained from the base of the skull through the vertex without intravenous contrast. COMPARISON:  None. FINDINGS: Brain: No evidence of acute infarction,  hemorrhage or extra-axial collection. There is moderate to marked severity dilatation of the lateral ventricles and third ventricle. A 5.9 cm x 5.0 cm x 4.1 cm predominately cystic appearing area is seen within the cerebellum, along the midline. There is marked severity mass effect, and subsequent compression, on the fourth ventricle which is anterior to this region (axial CT images 10 through 13, CT series number 3). Vascular: No hyperdense vessel or unexpected calcification. Skull: Normal. Negative for fracture or focal lesion. Sinuses/Orbits: No acute finding. Other: None. IMPRESSION: 1. Large, predominately cystic-appearing area within the cerebellum, with mass effect on the fourth ventricle and subsequent obstructive hydrocephalus. An anterior soft tissue component cannot be excluded. MRI correlation is recommended. 2. Moderate to marked severity dilatation of the lateral ventricles and third ventricle. 3. No evidence of acute infarction, hemorrhage or extra-axial collection. Electronically Signed   By: Virgina Norfolk M.D.   On: 11/29/2019 00:29   MR Brain W and Wo Contrast  Result Date: 11/29/2019 CLINICAL DATA:  Initial evaluation for acute migraine headaches for 3 weeks, visual changes. EXAM: MRI HEAD WITHOUT AND WITH CONTRAST TECHNIQUE: Multiplanar, multiecho pulse sequences of the brain and surrounding structures were obtained without and with intravenous contrast. CONTRAST:  9.31mL GADAVIST GADOBUTROL 1 MMOL/ML IV SOLN COMPARISON:  Prior head CT from earlier the same day. FINDINGS: Brain: Cystic mass positioned within the central aspect of the cerebellum measures 5.4 x 4.8 x 4.2 cm (AP by transverse by craniocaudad). Lesion is well-circumscribed with minimal surrounding edema/FLAIR signal intensity. The cystic component of this lesion follows CSF signal on all pulse sequences. There is an enhancing mural nodule at the posterior aspect of this lesion just to the left of midline measuring 12 x 6 x 13  mm (series 10, image 17). No other significant internal complexity, solid component, or enhancement. Secondary mass effect on the adjacent fourth ventricle which is almost entirely effaced. Associated obstructive hydrocephalus with marked dilatation of the lateral and third ventricles. The dilated third ventricle descends to compressed the suprasellar cistern. Periventricular T2/FLAIR hyperintensity compatible with transependymal flow of CSF. Additionally, there is evidence for transtentorial herniation with the cerebellar tonsils beaked and extending up to 17 mm through the foramen magnum. Mass effect on the brainstem which is flattened and somewhat displaced anteriorly. Diffuse cerebral edema seen elsewhere throughout the brain. Diffuse FLAIR signal intensity with leptomeningeal enhancement seen along the cortical sulci compatible with congestion. No other mass lesion or abnormal enhancement. No evidence for acute or subacute infarct. No encephalomalacia to suggest chronic cortical infarction. Small amount of susceptibility artifact noted associated with the mural nodule within the cerebellar lesion. No other evidence for acute or chronic intracranial hemorrhage. No made of an empty sella with flattening and compression of the pituitary gland, likely due to of cerebral edema and hydrocephalus. Vascular: Major intracranial vascular flow voids are maintained. Skull and upper cervical spine: Bone marrow signal intensity within normal limits. No scalp soft tissue abnormality. No hydromyelia within the visualized upper cervical spine. Sinuses/Orbits: Increased CSF density seen along the optic nerve sheaths with mild flattening at the posterior aspects of the globes, compatible  with elevated intracranial pressures. Globes and orbital soft tissues otherwise unremarkable. Mild scattered mucosal thickening noted within the ethmoidal air cells. Paranasal sinuses are otherwise largely clear. No mastoid effusion. Inner ear  structures grossly normal. Other: None. IMPRESSION: 1. 5.8 x 4.8 x 4.2 cm cystic mass with enhancing mural nodule positioned within the mid cerebellum. 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 Possible hemangioblastoma would be the primary differential consideration. 2. 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 Neuro surgical consultation recommended. Electronically Signed   By: Jeannine Boga M.D.   On: 11/29/2019 03:38    Procedures .Critical Care Performed by: Ripley Fraise, MD Authorized by: Ripley Fraise, MD   Critical care provider statement:    Critical care time (minutes):  35   Critical care start time:  11/29/2019 4:30 AM   Critical care end time:  11/29/2019 5:05 AM   Critical care time was exclusive of:  Separately billable procedures and treating other patients   Critical care was necessary to treat or prevent imminent or life-threatening deterioration of the following conditions:  CNS failure or compromise   Critical care was time spent personally by me on the following activities:  Ordering and performing treatments and interventions, ordering and review of radiographic studies, pulse oximetry, re-evaluation of patient's condition, evaluation of patient's response to treatment, discussions with consultants, obtaining history from patient or surrogate, ordering and review of laboratory studies, development of treatment plan with patient or surrogate and examination of patient   I assumed direction of critical care for this patient from another provider in my specialty: no      Medications Ordered in ED Medications  gadobutrol (GADAVIST) 1 MMOL/ML injection 9.7 mL (9.7 mLs Intravenous Contrast Given 11/29/19 0230)  dexamethasone (DECADRON) injection 10 mg (10 mg Intravenous Given 11/29/19 0456)    ED  Course  I have reviewed the triage vital signs and the nursing notes.  Pertinent labs & imaging results that were available during my care of the patient were reviewed by me and considered in my medical decision making (see chart for details).    MDM Rules/Calculators/A&P                       This patient presents to the ED for concern of headache and blurred vision, this involves an extensive number of treatment options, and is a complaint that carries with it a high risk of complications and morbidity.  The differential diagnosis includes stroke, intracranial hemorrhage, brain tumor, subdural hematoma, migraine   Lab Tests:   I Ordered, reviewed, and interpreted labs, which included letter lites, complete blood count  Medicines ordered:   I ordered medication Decadron for tumor  Imaging Studies ordered:   I ordered imaging studies which included CT head and MRI brain I independently visualized and interpreted imaging which showed a cerebellar cystic mass  Consultations Obtained:   I consulted neurosurgery Dr. Christella Noa and discussed lab and imaging findings  Reevaluation:  After the interventions stated above, I reevaluated the patient and found patient stable  5:09 AM Patient reports he has had headache for several months with recent visual changes.  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  Case was discussed with neurosurgery on-call Dr. Christella Noa.  Patient will be admitted for operative management Patient is agreeable with  plan.  Patient's been given IV Decadron. Patient's mental status is appropriate at this time Final Clinical Impression(s) / ED Diagnoses Final diagnoses:  Cerebellar mass  Obstructive hydrocephalus [facility])    Rx / DC Orders ED Discharge Orders    None       Ripley Fraise, MD 11/29/19 0510

## 2019-11-29 NOTE — H&P (Signed)
Tommy Russell is an 29 y.o. male.   Chief Complaint: headaches, gait difficulties, and increasingly poor vision described as dark and blurred, emesis HPI: Tommy Russell is a 29 y.o. male With the above complaints noticed over the past year. The symptoms have been worsening over last 2-3 weeks. He had made appointments with Neurology, and Ophthalmology. Upon presentation to the Ed an MRI revealed a large cystic mass.   History reviewed. No pertinent past medical history.  History reviewed. No pertinent surgical history.  No family history on file. Social History:  reports that he has been smoking. He has never used smokeless tobacco. He reports current alcohol use. He reports that he does not use drugs.  Allergies: No Known Allergies  (Not in a hospital admission)   Results for orders placed or performed during the hospital encounter of 11/28/19 (from the past 48 hour(s))  Protime-INR     Status: None   Collection Time: 11/28/19 10:51 PM  Result Value Ref Range   Prothrombin Time 13.8 11.4 - 15.2 seconds   INR 1.1 0.8 - 1.2    Comment: (NOTE) INR goal varies based on device and disease states. Performed at [facility], [place] [address] [place], [place] [address]   APTT     Status: None   Collection Time: 11/28/19 10:51 PM  Result Value Ref Range   aPTT 28 24 - 36 seconds    Comment: Performed at [place] [address], [place] [address]  CBC     Status: None   Collection Time: 11/28/19 10:51 PM  Result Value Ref Range   WBC 6.8 4.0 - 10.5 K/uL   RBC 4.99 4.22 - 5.81 MIL/uL   Hemoglobin 15.5 13.0 - 17.0 g/dL   HCT 46.7 39.0 - 52.0 %   MCV 93.6 80.0 - 100.0 fL   MCH 31.1 26.0 - 34.0 pg   MCHC 33.2 30.0 - 36.0 g/dL   RDW 12.7 11.5 - 15.5 %   Platelets 319 150 - 400 K/uL   nRBC 0.0 0.0 - 0.2 %    Comment: Performed at [facility], [place] [address], [place], [place] [address]  Differential     Status: None   Collection Time: 11/28/19 10:51 PM   Result Value Ref Range   Neutrophils Relative % 69 %   Neutro Abs 4.7 1.7 - 7.7 K/uL   Lymphocytes Relative 22 %   Lymphs Abs 1.5 0.7 - 4.0 K/uL   Monocytes Relative 7 %   Monocytes Absolute 0.4 0.1 - 1.0 K/uL   Eosinophils Relative 1 %   Eosinophils Absolute 0.1 0.0 - 0.5 K/uL   Basophils Relative 1 %   Basophils Absolute 0.0 0.0 - 0.1 K/uL   Immature Granulocytes 0 %   Abs Immature Granulocytes 0.03 0.00 - 0.07 K/uL    Comment: Performed at [facility] [address] [place], [place] [address]  Comprehensive metabolic panel     Status: None   Collection Time: 11/28/19 10:51 PM  Result Value Ref Range   Sodium 142 135 - 145 mmol/L   Potassium 4.1 3.5 - 5.1 mmol/L   Chloride 103 98 - 111 mmol/L   CO2 27 22 - 32 mmol/L   Glucose, Bld 97 70 - 99 mg/dL    Comment: Glucose reference range applies only to samples taken after fasting for at least 8 hours.   BUN 10 6 - 20 mg/dL   Creatinine, Ser  0.92 0.61 - 1.24 mg/dL   Calcium 10.2 8.9 - 10.3 mg/dL   Total Protein 7.6 6.5 - 8.1 g/dL   Albumin 4.4 3.5 - 5.0 g/dL   AST 15 15 - 41 U/L   ALT 13 0 - 44 U/L   Alkaline Phosphatase 68 38 - 126 U/L   Total Bilirubin 1.0 0.3 - 1.2 mg/dL   GFR calc non Af Amer >60 >60 mL/min   GFR calc Af Amer >60 >60 mL/min   Anion gap 12 5 - 15    Comment: Performed at [facility], [place] [address], [place], [place] [address]  I-stat chem 8, ED     Status: None   Collection Time: 11/28/19 11:24 PM  Result Value Ref Range   Sodium 141 135 - 145 mmol/L   Potassium 4.0 3.5 - 5.1 mmol/L   Chloride 103 98 - 111 mmol/L   BUN 10 6 - 20 mg/dL   Creatinine, Ser 0.90 0.61 - 1.24 mg/dL   Glucose, Bld 88 70 - 99 mg/dL    Comment: Glucose reference range applies only to samples taken after fasting for at least 8 hours.   Calcium, Ion 1.23 1.15 - 1.40 mmol/L   TCO2 29 22 - 32 mmol/L   Hemoglobin 15.6 13.0 - 17.0 g/dL   HCT 46.0 39.0 - 52.0 %   CT HEAD WO CONTRAST  Result Date:  11/29/2019 CLINICAL DATA:  Dizziness and loss of vision with onset 2 weeks ago. EXAM: CT HEAD WITHOUT CONTRAST TECHNIQUE: Contiguous axial images were obtained from the base of the skull through the vertex without intravenous contrast. COMPARISON:  None. FINDINGS: Brain: No evidence of acute infarction, hemorrhage or extra-axial collection. There is moderate to marked severity dilatation of the lateral ventricles and third ventricle. A 5.9 cm x 5.0 cm x 4.1 cm predominately cystic appearing area is seen within the cerebellum, along the midline. There is marked severity mass effect, and subsequent compression, on the fourth ventricle which is anterior to this region (axial CT images 10 through 13, CT series number 3). Vascular: No hyperdense vessel or unexpected calcification. Skull: Normal. Negative for fracture or focal lesion. Sinuses/Orbits: No acute finding. Other: None. IMPRESSION: 1. Large, predominately cystic-appearing area within the cerebellum, with mass effect on the fourth ventricle and subsequent obstructive hydrocephalus. An anterior soft tissue component cannot be excluded. MRI correlation is recommended. 2. Moderate to marked severity dilatation of the lateral ventricles and third ventricle. 3. No evidence of acute infarction, hemorrhage or extra-axial collection. Electronically Signed   By: Virgina Norfolk M.D.   On: 11/29/2019 00:29   MR Brain W and Wo Contrast  Result Date: 11/29/2019 CLINICAL DATA:  Initial evaluation for acute migraine headaches for 3 weeks, visual changes. EXAM: MRI HEAD WITHOUT AND WITH CONTRAST TECHNIQUE: Multiplanar, multiecho pulse sequences of the brain and surrounding structures were obtained without and with intravenous contrast. CONTRAST:  9.45mL GADAVIST GADOBUTROL 1 MMOL/ML IV SOLN COMPARISON:  Prior head CT from earlier the same day. FINDINGS: Brain: Cystic mass positioned within the central aspect of the cerebellum measures 5.4 x 4.8 x 4.2 cm (AP by transverse  by craniocaudad). Lesion is well-circumscribed with minimal surrounding edema/FLAIR signal intensity. The cystic component of this lesion follows CSF signal on all pulse sequences. There is an enhancing mural nodule at the posterior aspect of this lesion just to the left of midline measuring 12 x 6 x 13 mm (series 10, image 17). No other significant internal complexity,  solid component, or enhancement. Secondary mass effect on the adjacent fourth ventricle which is almost entirely effaced. Associated obstructive hydrocephalus with marked dilatation of the lateral and third ventricles. The dilated third ventricle descends to compressed the suprasellar cistern. Periventricular T2/FLAIR hyperintensity compatible with transependymal flow of CSF. Additionally, there is evidence for transtentorial herniation with the cerebellar tonsils beaked and extending up to 17 mm through the foramen magnum. Mass effect on the brainstem which is flattened and somewhat displaced anteriorly. Diffuse cerebral edema seen elsewhere throughout the brain. Diffuse FLAIR signal intensity with leptomeningeal enhancement seen along the cortical sulci compatible with congestion. No other mass lesion or abnormal enhancement. No evidence for acute or subacute infarct. No encephalomalacia to suggest chronic cortical infarction. Small amount of susceptibility artifact noted associated with the mural nodule within the cerebellar lesion. No other evidence for acute or chronic intracranial hemorrhage. No made of an empty sella with flattening and compression of the pituitary gland, likely due to of cerebral edema and hydrocephalus. Vascular: Major intracranial vascular flow voids are maintained. Skull and upper cervical spine: Bone marrow signal intensity within normal limits. No scalp soft tissue abnormality. No hydromyelia within the visualized upper cervical spine. Sinuses/Orbits: Increased CSF density seen along the optic nerve sheaths with mild  flattening at the posterior aspects of the globes, compatible with elevated intracranial pressures. Globes and orbital soft tissues otherwise unremarkable. Mild scattered mucosal thickening noted within the ethmoidal air cells. Paranasal sinuses are otherwise largely clear. No mastoid effusion. Inner ear structures grossly normal. Other: None. IMPRESSION: 1. 5.8 x 4.8 x 4.2 cm cystic mass with enhancing mural nodule positioned within the mid cerebellum. 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 Possible hemangioblastoma would be the primary differential consideration. 2. 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 Neuro surgical consultation recommended. Electronically Signed   By: Jeannine Boga M.D.   On: 11/29/2019 03:38    Review of Systems  Constitutional: Positive for activity change.  HENT: Negative.        Headaches  Eyes: Positive for visual disturbance.  Respiratory: Negative.   Cardiovascular: Negative.   Gastrointestinal: Negative.   Endocrine: Negative.   Genitourinary: Negative.   Musculoskeletal: Negative.   Skin: Negative.   Neurological:       Gait disturbance Coordination difficulties  Hematological: Negative.   Psychiatric/Behavioral: Negative.     Blood pressure 138/64, pulse 75, temperature 98.4 F (36.9 C), temperature source Oral, resp. rate 18, SpO2 100 %. Physical Exam  Constitutional: He is oriented to person, place, and time. He appears well-developed and well-nourished. He appears distressed.  HENT:  Head: Normocephalic and atraumatic.  Right Ear: External ear normal.  Left Ear: External ear normal.  Nose: Nose normal.  Mouth/Throat: Oropharynx is clear and moist.  Eyes: Pupils are equal, round, and reactive to light.  Bilateral papilledema  Cardiovascular: Normal rate, regular  rhythm and normal heart sounds.  Respiratory: Effort normal and breath sounds normal.  GI: Soft. Bowel sounds are normal.  Musculoskeletal:        General: Normal range of motion.     Cervical back: Normal range of motion and neck supple.  Neurological: He is alert and oriented to person, place, and time. He has normal strength. A cranial nerve deficit is present. No sensory deficit. Coordination abnormal. He displays no Babinski's sign on the right side. He displays no Babinski's sign on  the left side.  Reflex Scores:      Tricep reflexes are 3+ on the right side and 3+ on the left side.      Bicep reflexes are 3+ on the right side and 3+ on the left side.      Brachioradialis reflexes are 3+ on the right side and 3+ on the left side.      Patellar reflexes are 3+ on the right side and 3+ on the left side.      Achilles reflexes are 3+ on the left side. Difficulty with finger nose finger testing Tongue is midline Uvula is midline Pupils reactive to light Heel shin testing is good No dysdiadochokinesis     Assessment/Plan Tommy Russell is a 29 y.o. male With a year long history of headaches, increasing difficulty with eyesight, and with gait. 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 I have recommended that the mass be resected. I have explained the rationale for resection. He will be admitted, started on decadron, and observed closely in the ICU due to his hydrocephalus.   Ashok Pall, MD 11/29/2019, 5:16 AM

## 2019-11-29 NOTE — Transfer of Care (Signed)
Immediate Anesthesia Transfer of Care Note  Patient: [name]  Procedure(s) Performed: SUBOCCIPITAL CRANIECTOMY CERVICAL LAMINECTOMY/DURAPLASTY FOR RESECTION OF MASSS AND PLACEMENT OF EXTERNAL VENTRICULAR DRAIN  (N/A Neck)  Patient Location: PACU  Anesthesia Type:General  Level of Consciousness: awake and pateint uncooperative  Airway & Oxygen Therapy: Patient Spontanous Breathing and Patient connected to face mask oxygen  Post-op Assessment: Report given to RN, Post -op Vital signs reviewed and stable and Patient moving all extremities  Post vital signs: Reviewed and stable  Last Vitals:  Vitals Value Taken Time  BP 138/69 11/29/19 1849  Temp    Pulse 123 11/29/19 1851  Resp 29 11/29/19 1851  SpO2 98 % 11/29/19 1851  Vitals shown include unvalidated device data.  Last Pain:  Vitals:   11/29/19 1200  TempSrc: Oral  PainSc: 4       Patients Stated Pain Goal: 3 ([id number])  Complications: No apparent anesthesia complications

## 2019-11-29 NOTE — Anesthesia Procedure Notes (Addendum)
Procedure Name: Intubation Date/Time: 11/29/2019 2:40 PM Performed by: Myna Bright, CRNA Pre-anesthesia Checklist: Patient identified, Emergency Drugs available, Suction available and Patient being monitored Patient Re-evaluated:Patient Re-evaluated prior to induction Oxygen Delivery Method: Circle System Utilized Preoxygenation: Pre-oxygenation with 100% oxygen Induction Type: IV induction Ventilation: Mask ventilation without difficulty Laryngoscope Size: Mac and 4 Grade View: Grade I Tube type: Oral Tube size: 7.5 mm Number of attempts: 1 Airway Equipment and Method: Stylet Placement Confirmation: ETT inserted through vocal cords under direct vision,  positive ETCO2 and breath sounds checked- equal and bilateral Secured at: 21 cm Tube secured with: Tape Dental Injury: Teeth and Oropharynx as per pre-operative assessment

## 2019-11-29 NOTE — Anesthesia Procedure Notes (Signed)
Arterial Line Insertion Start/End4/29/2021 1:45 PM Performed by: Gaylene Brooks, CRNA  Patient location: Pre-op. Preanesthetic checklist: patient identified, IV checked, site marked, risks and benefits discussed, surgical consent, monitors and equipment checked, pre-op evaluation, timeout performed and anesthesia consent Lidocaine 1% used for infiltration Right, radial was placed Catheter size: 18 G Hand hygiene performed  and maximum sterile barriers used   Attempts: 1 Procedure performed without using ultrasound guided technique. Post procedure assessment: normal  Patient tolerated the procedure well with no immediate complications. Additional procedure comments: Anterior placement with 18G angiocath.

## 2019-11-30 ENCOUNTER — Encounter: Payer: Self-pay | Admitting: *Deleted

## 2019-11-30 LAB — BASIC METABOLIC PANEL
Anion gap: 11 (ref 5–15)
BUN: 10 mg/dL (ref 6–20)
CO2: 24 mmol/L (ref 22–32)
Calcium: 9.6 mg/dL (ref 8.9–10.3)
Chloride: 104 mmol/L (ref 98–111)
Creatinine, Ser: 1 mg/dL (ref 0.61–1.24)
GFR calc Af Amer: >60 mL/min (ref >60)
GFR calc non Af Amer: >60 mL/min (ref >60)
Glucose, Bld: 139 mg/dL — ABNORMAL HIGH (ref 70–99)
Potassium: 4 mmol/L (ref 3.5–5.1)
Sodium: 139 mmol/L (ref 135–145)

## 2019-11-30 MED ORDER — DEXAMETHASONE SODIUM PHOSPHATE 4 MG/ML IJ SOLN
4.0000 mg | Freq: Four times a day (QID) | INTRAMUSCULAR | Status: DC
  Administered 2019-12-01 (×2): 4 mg via INTRAVENOUS
  Filled 2019-11-30 (×2): qty 1

## 2019-11-30 MED ORDER — CHLORHEXIDINE GLUCONATE CLOTH 2 % EX PADS
6.0000 | MEDICATED_PAD | Freq: Every day | CUTANEOUS | Status: DC
  Administered 2019-12-01 – 2019-12-07 (×6): 6 via TOPICAL

## 2019-11-30 MED FILL — Thrombin For Soln 5000 Unit: CUTANEOUS | Qty: 5000 | Status: AC

## 2019-11-30 NOTE — Progress Notes (Signed)
Patient ID: Tommy Russell, male   DOB: August 09, 1990, 29 y.o.   MRN: [id number] BP 116/73   Pulse 71   Temp 98.4 F (36.9 C) (Oral)   Resp 13   Ht 5\' 8"  (1.727 m)   Wt 81 kg   SpO2 96%   BMI 27.15 kg/m  Alert and oriented x 4, speech is clear, fluent perrl Wound is clean, dry, no signs of infection Moving all extremities Guarding his neck secondary to pain ventric is working well Doing very well post op

## 2019-12-01 ENCOUNTER — Encounter: Payer: Self-pay | Admitting: Anesthesiology

## 2019-12-01 LAB — BASIC METABOLIC PANEL
Anion gap: 9 (ref 5–15)
BUN: 10 mg/dL (ref 6–20)
CO2: 24 mmol/L (ref 22–32)
Calcium: 9.2 mg/dL (ref 8.9–10.3)
Chloride: 107 mmol/L (ref 98–111)
Creatinine, Ser: 0.84 mg/dL (ref 0.61–1.24)
GFR calc Af Amer: >60 mL/min (ref >60)
GFR calc non Af Amer: >60 mL/min (ref >60)
Glucose, Bld: 145 mg/dL — ABNORMAL HIGH (ref 70–99)
Potassium: 4.2 mmol/L (ref 3.5–5.1)
Sodium: 140 mmol/L (ref 135–145)

## 2019-12-01 MED ORDER — PANTOPRAZOLE SODIUM 40 MG PO TBEC
40.0000 mg | DELAYED_RELEASE_TABLET | Freq: Every day | ORAL | Status: DC
  Administered 2019-12-01 – 2019-12-06 (×6): 40 mg via ORAL
  Filled 2019-12-01 (×6): qty 1

## 2019-12-01 MED ORDER — LEVETIRACETAM 500 MG PO TABS
500.0000 mg | ORAL_TABLET | Freq: Two times a day (BID) | ORAL | Status: DC
  Administered 2019-12-01 – 2019-12-05 (×9): 500 mg via ORAL
  Filled 2019-12-01 (×9): qty 1

## 2019-12-01 MED ORDER — DEXAMETHASONE 4 MG PO TABS
4.0000 mg | ORAL_TABLET | Freq: Two times a day (BID) | ORAL | Status: DC
  Administered 2019-12-01 (×2): 4 mg via ORAL
  Filled 2019-12-01 (×2): qty 1

## 2019-12-01 NOTE — Progress Notes (Signed)
Neurosurgery Service Progress Note  Subjective: No acute events overnight   Objective: Vitals:   12/01/19 0600 12/01/19 0700 12/01/19 0800 12/01/19 0900  BP: (!) 115/55 115/66 113/70 124/77  Pulse: 66 70 75 70  Resp: 13 17 12 18   Temp:   99.6 F (37.6 C)   TempSrc:   Oral   SpO2: 93% 96% 95% 97%  Weight:      Height:       Temp (24hrs), Avg:99.2 F (37.3 C), Min:98.4 F (36.9 C), Max:99.6 F (37.6 C)  CBC Latest Ref Rng & Units 11/29/2019 11/28/2019 11/28/2019  WBC 4.0 - 10.5 K/uL 6.5 - 6.8  Hemoglobin 13.0 - 17.0 g/dL 14.5 15.6 15.5  Hematocrit 39.0 - 52.0 % 43.3 46.0 46.7  Platelets 150 - 400 K/uL 315 - 319   BMP Latest Ref Rng & Units 12/01/2019 11/30/2019 11/29/2019  Glucose 70 - 99 mg/dL 145(H) 139(H) -  BUN 6 - 20 mg/dL 10 10 -  Creatinine 0.61 - 1.24 mg/dL 0.84 1.00 0.83  Sodium 135 - 145 mmol/L 140 139 -  Potassium 3.5 - 5.1 mmol/L 4.2 4.0 -  Chloride 98 - 111 mmol/L 107 104 -  CO2 22 - 32 mmol/L 24 24 -  Calcium 8.9 - 10.3 mg/dL 9.2 9.6 -    Intake/Output Summary (Last 24 hours) at 12/01/2019 1001 Last data filed at 12/01/2019 0900 Gross per 24 hour  Intake 2332.11 ml  Output 3478 ml  Net -1145.89 ml    Current Facility-Administered Medications:  .  0.9 %  sodium chloride infusion, 250 mL, Intravenous, PRN, Ashok Pall, MD, New Bag at 11/29/19 1850 .  0.9 % NaCl with KCl 20 mEq/ L  infusion, , Intravenous, Continuous, Cabbell, Kyle, MD, Last Rate: 80 mL/hr at 12/01/19 0900, Rate Verify at 12/01/19 0900 .  acetaminophen (TYLENOL) tablet 650 mg, 650 mg, Oral, Q6H PRN, 650 mg at 12/01/19 0807 **OR** acetaminophen (TYLENOL) suppository 650 mg, 650 mg, Rectal, Q6H PRN, Ashok Pall, MD .  bisacodyl (DULCOLAX) EC tablet 5 mg, 5 mg, Oral, Daily PRN, Ashok Pall, MD .  ceFAZolin (ANCEF) IVPB 1 g/50 mL premix, 1 g, Intravenous, Q8H, Ashok Pall, MD, Stopped at 12/01/19 0640 .  Chlorhexidine Gluconate Cloth 2 % PADS 6 each, 6 each, Topical, Daily, Pessy Delamar A,  MD .  dexamethasone (DECADRON) injection 4 mg, 4 mg, Intravenous, Q6H, Cabbell, Kyle, MD, 4 mg at 12/01/19 0559 .  heparin injection 5,000 Units, 5,000 Units, Subcutaneous, Q8H, Ashok Pall, MD, 5,000 Units at 12/01/19 0602 .  HYDROcodone-acetaminophen (NORCO/VICODIN) 5-325 MG per tablet 1-2 tablet, 1-2 tablet, Oral, Q4H PRN, Ashok Pall, MD, 2 tablet at 12/01/19 0959 .  labetalol (NORMODYNE) injection 10-40 mg, 10-40 mg, Intravenous, Q10 min PRN, Ashok Pall, MD .  levETIRAcetam (KEPPRA) IVPB 500 mg/100 mL premix, 500 mg, Intravenous, Q12H, Ashok Pall, MD, Stopped at 12/01/19 0820 .  menthol-cetylpyridinium (CEPACOL) lozenge 3 mg, 1 lozenge, Oral, PRN, Ashok Pall, MD, 3 mg at 11/30/19 1301 .  morphine 2 MG/ML injection 2 mg, 2 mg, Intravenous, Q2H PRN, Ashok Pall, MD, 2 mg at 12/01/19 0909 .  mupirocin ointment (BACTROBAN) 2 % 1 application, 1 application, Nasal, BID, Ashok Pall, MD, 1 application at [id number] 0913 .  naloxone [facility]) injection 0.08 mg, 0.08 mg, Intravenous, PRN, Ashok Pall, MD .  ondansetron (ZOFRAN) tablet 4 mg, 4 mg, Oral, Q6H PRN **OR** ondansetron (ZOFRAN) injection 4 mg, 4 mg, Intravenous, Q6H PRN, Ashok Pall, MD, 4 mg at 12/01/19 0909 .  pantoprazole (PROTONIX) injection 40 mg, 40 mg, Intravenous, QHS, Ashok Pall, MD, 40 mg at 11/30/19 2201 .  senna-docusate (Senokot-S) tablet 1 tablet, 1 tablet, Oral, QHS PRN, Ashok Pall, MD .  sodium chloride flush (NS) 0.9 % injection 3 mL, 3 mL, Intravenous, Q12H, Ashok Pall, MD, 3 mL at 12/01/19 0913 .  sodium chloride flush (NS) 0.9 % injection 3 mL, 3 mL, Intravenous, PRN, Ashok Pall, MD .  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  -cont EVD at  10, high output but asymptomatic, likely will start to wean vs clamp trial tomorrow -wean dex to 4bid po -keppra to po -can hep lock IV -SCDs/TEDs/SQH  Marcello Moores A Gerard Cantara  12/01/19 10:01 AM

## 2019-12-01 NOTE — Anesthesia Postprocedure Evaluation (Signed)
Anesthesia Post Note  Patient: Tommy Russell  Procedure(s) Performed: SUBOCCIPITAL CRANIECTOMY CERVICAL LAMINECTOMY/DURAPLASTY FOR RESECTION OF MASSS AND PLACEMENT OF EXTERNAL VENTRICULAR DRAIN  (N/A Neck)     Patient location during evaluation: PACU Anesthesia Type: General Level of consciousness: awake and alert Pain management: pain level controlled Vital Signs Assessment: post-procedure vital signs reviewed and stable Respiratory status: spontaneous breathing, nonlabored ventilation, respiratory function stable and patient connected to nasal cannula oxygen Cardiovascular status: blood pressure returned to baseline and stable Postop Assessment: no apparent nausea or vomiting Anesthetic complications: no    Last Vitals:  Vitals:   12/01/19 0900 12/01/19 1000  BP: 124/77 115/72  Pulse: 70 72  Resp: 18 15  Temp:    SpO2: 97% 97%    Last Pain:  Vitals:   12/01/19 0800  TempSrc: Oral  PainSc:                  [name]

## 2019-12-01 NOTE — Anesthesia Preprocedure Evaluation (Signed)
Anesthesia Evaluation  Patient identified by MRN, date of birth, ID band Patient awake    Reviewed: Allergy & Precautions, NPO status , Patient's Chart, lab work & pertinent test results  History of Anesthesia Complications Negative for: history of anesthetic complications  Airway Mallampati: II  TM Distance: >3 FB Neck ROM: Full    Dental  (+) Dental Advisory Given, Teeth Intact   Pulmonary Current Smoker and Patient abstained from smoking.,    breath sounds clear to auscultation       Cardiovascular negative cardio ROS   Rhythm:Regular     Neuro/Psych Brain mass with elevated ICP    GI/Hepatic negative GI ROS, Neg liver ROS,   Endo/Other  negative endocrine ROS  Renal/GU negative Renal ROS     Musculoskeletal negative musculoskeletal ROS (+)   Abdominal   Peds  Hematology negative hematology ROS (+)   Anesthesia Other Findings   Reproductive/Obstetrics                             Anesthesia Physical Anesthesia Plan  ASA: II  Anesthesia Plan: General   Post-op Pain Management:    Induction: Intravenous  PONV Risk Score and Plan: 1 and Ondansetron and Dexamethasone  Airway Management Planned: Oral ETT  Additional Equipment: Arterial line  Intra-op Plan:   Post-operative Plan: Extubation in OR  Informed Consent: I have reviewed the patients History and Physical, chart, labs and discussed the procedure including the risks, benefits and alternatives for the proposed anesthesia with the patient or authorized representative who has indicated his/her understanding and acceptance.     Dental advisory given  Plan Discussed with: CRNA and Surgeon  Anesthesia Plan Comments:         Anesthesia Quick Evaluation

## 2019-12-02 MED ORDER — DEXAMETHASONE 4 MG PO TABS
2.0000 mg | ORAL_TABLET | Freq: Two times a day (BID) | ORAL | Status: DC
  Administered 2019-12-02 – 2019-12-07 (×11): 2 mg via ORAL
  Filled 2019-12-02 (×11): qty 1

## 2019-12-02 NOTE — Progress Notes (Signed)
Neurosurgery Service Progress Note  Subjective: No acute events overnight   Objective: Vitals:   12/02/19 0600 12/02/19 0700 12/02/19 0800 12/02/19 0900  BP: 119/65 103/63 119/63 130/65  Pulse: 65 64 78 79  Resp: 18 14 17 12   Temp:   98.3 F (36.8 C)   TempSrc:   Oral   SpO2: 98% 96% 98% 97%  Weight:      Height:       Temp (24hrs), Avg:98.6 F (37 C), Min:98.3 F (36.8 C), Max:98.8 F (37.1 C)  CBC Latest Ref Rng & Units 11/29/2019 11/28/2019 11/28/2019  WBC 4.0 - 10.5 K/uL 6.5 - 6.8  Hemoglobin 13.0 - 17.0 g/dL 14.5 15.6 15.5  Hematocrit 39.0 - 52.0 % 43.3 46.0 46.7  Platelets 150 - 400 K/uL 315 - 319   BMP Latest Ref Rng & Units 12/01/2019 11/30/2019 11/29/2019  Glucose 70 - 99 mg/dL 145(H) 139(H) -  BUN 6 - 20 mg/dL 10 10 -  Creatinine 0.61 - 1.24 mg/dL 0.84 1.00 0.83  Sodium 135 - 145 mmol/L 140 139 -  Potassium 3.5 - 5.1 mmol/L 4.2 4.0 -  Chloride 98 - 111 mmol/L 107 104 -  CO2 22 - 32 mmol/L 24 24 -  Calcium 8.9 - 10.3 mg/dL 9.2 9.6 -    Intake/Output Summary (Last 24 hours) at 12/02/2019 0948 Last data filed at 12/02/2019 0800 Gross per 24 hour  Intake 312.87 ml  Output 1566 ml  Net -1253.13 ml    Current Facility-Administered Medications:  .  0.9 %  sodium chloride infusion, 250 mL, Intravenous, PRN, Ashok Pall, MD, New Bag at 11/29/19 1850 .  acetaminophen (TYLENOL) tablet 650 mg, 650 mg, Oral, Q6H PRN, 650 mg at 12/01/19 0807 **OR** acetaminophen (TYLENOL) suppository 650 mg, 650 mg, Rectal, Q6H PRN, Ashok Pall, MD .  bisacodyl (DULCOLAX) EC tablet 5 mg, 5 mg, Oral, Daily PRN, Ashok Pall, MD .  ceFAZolin (ANCEF) IVPB 1 g/50 mL premix, 1 g, Intravenous, Q8H, Cabbell, Kyle, MD, Last Rate: 100 mL/hr at 12/02/19 0551, 1 g at 12/02/19 0551 .  Chlorhexidine Gluconate Cloth 2 % PADS 6 each, 6 each, Topical, Daily, Judith Part, MD, 6 each at 12/01/19 1402 .  dexamethasone (DECADRON) tablet 4 mg, 4 mg, Oral, Q12H, Hiram Mciver A, MD, 4 mg at 12/01/19  2250 .  heparin injection 5,000 Units, 5,000 Units, Subcutaneous, Q8H, Ashok Pall, MD, 5,000 Units at 12/02/19 0548 .  HYDROcodone-acetaminophen (NORCO/VICODIN) 5-325 MG per tablet 1-2 tablet, 1-2 tablet, Oral, Q4H PRN, Ashok Pall, MD, 2 tablet at 12/01/19 2323 .  labetalol (NORMODYNE) injection 10-40 mg, 10-40 mg, Intravenous, Q10 min PRN, Ashok Pall, MD .  levETIRAcetam (KEPPRA) tablet 500 mg, 500 mg, Oral, BID, Judith Part, MD, 500 mg at 12/01/19 2254 .  menthol-cetylpyridinium (CEPACOL) lozenge 3 mg, 1 lozenge, Oral, PRN, Ashok Pall, MD, 3 mg at 11/30/19 1301 .  morphine 2 MG/ML injection 2 mg, 2 mg, Intravenous, Q2H PRN, Ashok Pall, MD, 2 mg at 12/02/19 0802 .  mupirocin ointment (BACTROBAN) 2 % 1 application, 1 application, Nasal, BID, Ashok Pall, MD, 1 application at [id number] 2257 .  naloxone [facility]) injection 0.08 mg, 0.08 mg, Intravenous, PRN, Ashok Pall, MD .  ondansetron (ZOFRAN) tablet 4 mg, 4 mg, Oral, Q6H PRN **OR** ondansetron (ZOFRAN) injection 4 mg, 4 mg, Intravenous, Q6H PRN, Ashok Pall, MD, 4 mg at 12/02/19 0802 .  pantoprazole (PROTONIX) EC tablet 40 mg, 40 mg, Oral, QHS, Ashok Pall, MD, 40 mg at  12/01/19 2251 .  senna-docusate (Senokot-S) tablet 1 tablet, 1 tablet, Oral, QHS PRN, Ashok Pall, MD .  sodium chloride flush (NS) 0.9 % injection 3 mL, 3 mL, Intravenous, Q12H, Ashok Pall, MD, 3 mL at 12/01/19 2259 .  sodium chloride flush (NS) 0.9 % injection 3 mL, 3 mL, Intravenous, PRN, Ashok Pall, MD .  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  -EVD still put out 500cc at +10, will raise to +15 today  -dex to 2bid po -po keppra -can hep lock IV -SCDs/TEDs/SQH  Judith Part   12/02/19 9:48 AM

## 2019-12-03 ENCOUNTER — Inpatient Hospital Stay (HOSPITAL_COMMUNITY): Payer: BC Managed Care – PPO

## 2019-12-03 MED ORDER — IOHEXOL 300 MG/ML  SOLN
75.0000 mL | Freq: Once | INTRAMUSCULAR | Status: AC | PRN
  Administered 2019-12-03: 75 mL via INTRAVENOUS

## 2019-12-03 NOTE — Progress Notes (Signed)
OT Cancellation Note  Patient Details Name: Tommy Russell MRN: [id number] DOB: 1991-03-13   Cancelled Treatment:    Reason Eval/Treat Not Completed: (P) Patient at procedure or test/ unavailable(CT)  Amaya Blakeman,HILLARY 12/03/2019, 2:24 PM  Maurie Boettcher, OT/L   Acute OT Clinical Specialist Acute Rehabilitation Services Pager [phone] Office [phone]

## 2019-12-03 NOTE — Progress Notes (Signed)
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 Pt currently requires Min A with mobility and ADL tasks due to deficits with balance and vision. Mom reports some changes with cognition regarding memory and word finding at times however reports that is improving. Began educating pt/family on compensatory strategies for low vision. Recommend S for all mobility and ADL after DC and follow up with outpt OT. Recommend pt follow up with his eye doctor. Will provide information on [facility]. Will follow acutely.     12/03/19 1900  OT Visit Information  Last OT Received On 12/03/19  Assistance Needed +1  History of Present Illness 29 y.o. male admitted on 11/28/19 for HA, gait difficulty and incresingly poor vision (dark and blurred).  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  Pt s/p suboccipital craniectomy, cervical laminectomy/duraplasty and placement of external ventricular drain on 11/29/19.  Pt with no other significant PMH.   Precautions  Precautions Other (comment);Fall  Precaution Comments IVD needs to be clamped for mobility.   Home Living  Family/patient expects to be discharged to: Private residence  Living Arrangements Spouse/significant other;Parent (fiancee and mom)  Available Help at Discharge Family;Available 24 hours/day (pt's house with fiancee at night, mom's house during the day)  Type of [place] to enter  Entrance Stairs-Number of Steps 3  Entrance Stairs-Rails None  Home Layout One level (his home is one level, mom's is 2)  Teacher, English as a foreign language Yes  How Accessible Accessible via walker (sideways)  Home Equipment None  Prior Function  Level of Independence Needs assistance  Gait / Transfers Assistance Needed pt reports staggering to  the right PTA, but never fell, was "careful".  Worked at [employer], did not drive.  Has 3 children (only one of them, the youngest 92 mo old girl lives with him and his fiancee currently the other two are in [place] with their mother).    ADL's / Homemaking Assistance Needed independent  Communication  Communication No difficulties  Pain Assessment  Pain Assessment 0-10  Pain Score 5  Pain Location posterior neck and head  Pain Descriptors / Indicators Grimacing;Guarding  Pain Intervention(s) Limited activity within patient's tolerance  Cognition  Arousal/Alertness Awake/alert  Behavior During Therapy WFL for tasks assessed/performed  Overall Cognitive Status Impaired/Different from baseline  Area of Impairment Memory;Attention  Current Attention Level Selective  Memory Decreased short-term memory  General Comments per mom, pt has trouble with keeping his attention on his "train of thoughts/ difficulty finding words" at times  Upper Extremity Assessment  Upper Extremity Assessment Overall [facility] for tasks assessed  Lower Extremity Assessment  Lower Extremity Assessment Defer to PT evaluation  Cervical / Trunk Assessment  Cervical / Trunk Assessment Other exceptions  Cervical / Trunk Exceptions posterior head/neck incision (a bit of forward head due to pain).   ADL  Overall ADL's  Needs assistance/impaired  Eating/Feeding Minimal assistance  Eating/Feeding Details (indicate cue type and reason) Began educating fmaily on compensatory strategies to help locate items on tray by using contrast adn reducing clutter. also began education on using "clock technique" to locate items on tray. Apparent difficulty with depth perception  Grooming Set up;Supervision/safety;Sitting  Upper Body Bathing Set up;Supervision/ safety;Sitting  Lower Body Bathing Minimal assistance;Sit to/from stand  Upper Body Dressing  Min guard;Sitting  Lower Body Dressing Minimal assistance;Sit to/from Writer Minimal assistance;Ambulation  Toileting- Clothing Manipulation and Hygiene Minimal assistance;Sit to/from stand  Functional mobility during ADLs Minimal assistance;Cueing for safety  Vision- History  Baseline Vision/History Wears glasses  Wears Glasses At all times  Patient Visual Report Blurring of vision  Vision- Assessment  Vision Assessment? Yes  Eye Alignment WFL  Ocular Range of Motion [facility]  Alignment/Gaze Preference WDL  Tracking/Visual Pursuits Decreased smoothness of horizontal tracking;Decreased smoothness of vertical tracking  Saccades Additional eye shifts occurred during testing;Additional head turns occurred during testing;Decreased speed of saccadic movement  Convergence WFL  Visual Fields Impaired-to be further tested in functional context  Depth Perception Overshoots  Additional Comments R eye appears worse than L; Pt able to rad large font with L eye only; unable to see with R eye  Perception  Comments Appeasr [facility]  Praxis  Praxis tested? WFL  Bed Mobility  Overal bed mobility Needs Assistance  Bed Mobility Supine to Sit  Supine to sit HOB elevated;Supervision  Transfers  Overall transfer level Needs assistance  Equipment used 1 person hand held assist  Transfers Sit to/from Stand  Sit to Stand Min assist  General transfer comment Min hand held assist to stand from EOB, very slow transition and over flexed legs and wide BOS.  Cues for safety when going to sit down in the recliner chair.   Balance  Overall balance assessment Needs assistance  Sitting-balance support Feet supported;Bilateral upper extremity supported  Sitting balance-Leahy Scale Fair  Postural control  (very mild)  Standing balance support Bilateral upper extremity supported  Standing balance-Leahy Scale Poor  Standing balance comment needs external support in standing and during gait for balance. R bias  Exercises  Exercises Other exercises  Other Exercises  Other Exercises eye  hand coordiantion activites - cup stacking  Other Exercises began educating on using sense of touch to compensate for visual deficits  OT - End of Session  Equipment Utilized During Treatment Gait belt  Activity Tolerance Patient tolerated treatment well  Patient left in chair;with call bell/phone within reach;with chair alarm set;with family/visitor present  Nurse Communication Mobility status  OT Assessment  OT Recommendation/Assessment Patient needs continued OT Services  OT Visit Diagnosis Other abnormalities of gait and mobility (R26.89);Unsteadiness on feet (R26.81);Muscle weakness (generalized) (M62.81);Low vision, both eyes (H54.2);Other symptoms and signs involving cognitive function;Pain  Pain - part of body  (head/neck)  OT Problem List Decreased activity tolerance;Impaired balance (sitting and/or standing);Impaired vision/perception;Decreased coordination;Decreased cognition;Decreased safety awareness;Decreased knowledge of use of DME or AE;Pain  OT Plan  OT Frequency (ACUTE ONLY) Min 3X/week  OT Treatment/Interventions (ACUTE ONLY) Self-care/ADL training;Neuromuscular education;DME and/or AE instruction;Therapeutic activities;Visual/perceptual remediation/compensation;Cognitive remediation/compensation;Patient/family education;Balance training  AM-PAC OT "6 Clicks" Daily Activity Outcome Measure (Version 2)  Help from another person eating meals? 3  Help from another person taking care of personal grooming? 3  Help from another person toileting, which includes using toliet, bedpan, or urinal? 3  Help from another person bathing (including washing, rinsing, drying)? 3  Help from another person to put on and taking off regular upper body clothing? 3  Help from another person to put on and taking off regular lower body clothing? 3  6 Click Score 18  OT Recommendation  Recommendations for Other Services Other (comment) (SW consult for counseling/coping with life change/stress)   Follow Up Recommendations Outpatient OT;Supervision/Assistance - 24 hour  OT Equipment 3 in 1 bedside commode (to use as shower  seat)  Individuals Consulted  Consulted and Agree with Results and Recommendations Patient;Family member/caregiver  Family Member Consulted mom  Acute Rehab OT Goals  Patient Stated Goal to get his vision back  OT Goal Formulation With patient  Time For Goal Achievement 12/17/19  Potential to Achieve Goals Good  OT Time Calculation  OT Start Time (ACUTE ONLY) 1545  OT Stop Time (ACUTE ONLY) 1615  OT Time Calculation (min) 30 min  OT General Charges  $OT Visit 1 Visit  OT Evaluation  $OT Eval Moderate Complexity 1 Mod  OT Treatments  $Self Care/Home Management  8-22 mins  Written Expression  Dominant Hand Right  Maurie Boettcher, OT/L   Acute OT Clinical Specialist [place] Pager [phone] Office [phone]

## 2019-12-03 NOTE — Evaluation (Signed)
Physical Therapy Evaluation Patient Details Name: Tommy Russell MRN: [id number] DOB: 04/05/1991 Today's Date: 12/03/2019   History of Present Illness  29 y.o. male admitted on 11/28/19 for HA, gait difficulty and incresingly poor vision (dark and blurred).  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  Pt s/p suboccipital craniectomy, cervical laminectomy/duraplasty and placement of external ventricular drain on 11/29/19.  Pt with no other significant PMH.   Clinical Impression  Pt was able to walk around the ICU and the progressive care unit with two person min hand held assist.  I think he could have done it with one, but he has a very hands on family and I think it boosted his confidence to have someone on each side of him.  He lists and stumbles to the right and his vision is very impaired (blurry and continued reports of darker on the R).  He has significant difficulty reading large print on signs in his room.  He will have support from his mom and fiancee around the clock upon discharge and would benefit from outpatient therapy at discharge at a neuro specialty clinic.  PT will continue to follow acutely for safe mobility progression.    Follow Up Recommendations Outpatient PT;Supervision for mobility/OOB    Equipment Recommendations  None recommended by PT    Recommendations for Other Services   NA    Precautions / Restrictions Precautions Precautions: Other (comment);Fall Precaution Comments: IVD needs to be clamped for mobility.       Mobility  Bed Mobility Overal bed mobility: Needs Assistance Bed Mobility: Supine to Sit     Supine to sit: Min assist;HOB elevated     General bed mobility comments: Mom provided assist to EOB.  She did not let him help much at all in mobility.  I did not deterr her help, but did tell her that we would like to progress to seeing what Tommy Russell can do on his own soon.   Transfers Overall transfer level: Needs assistance Equipment  used: 1 person hand held assist Transfers: Sit to/from Stand Sit to Stand: Min assist         General transfer comment: Min hand held assist to stand from EOB, very slow transition and over flexed legs and wide BOS.  Cues for safety when going to sit down in the recliner chair.   Ambulation/Gait Ambulation/Gait assistance: Min assist;+2 physical assistance Gait Distance (Feet): 300 Feet Assistive device: 2 person hand held assist Gait Pattern/deviations: Step-through pattern;Staggering right;Drifts right/left Gait velocity: decreased Gait velocity interpretation: <1.8 ft/sec, indicate of risk for recurrent falls General Gait Details: Pt with slow, cautious gait speed, cues for obstacle management.  Min assist for balance and pt's preference was to have someone on each side (although I do not physically think he needed two, this was more for confidence).  Pt staggered to the right and had to take three standing rest breaks as he "felt dizziness coming on" Pt reports his vision is very blurry and darker in his right eye than his left.  he had difficulty reading signs even with large font.          Balance Overall balance assessment: Needs assistance Sitting-balance support: Feet supported;Bilateral upper extremity supported Sitting balance-Leahy Scale: Fair Sitting balance - Comments: close supervision and bil UE support EOB.  Postural control: Right lateral lean(very mild) Standing balance support: Bilateral upper extremity supported Standing balance-Leahy Scale: Poor Standing balance comment: needs external support in standing and during gait for balance.  Pertinent Vitals/Pain Pain Assessment: Faces Faces Pain Scale: Hurts even more Pain Location: posterior neck and head Pain Descriptors / Indicators: Grimacing;Guarding Pain Intervention(s): Limited activity within patient's tolerance;Monitored during session;Repositioned    Home  Living Family/patient expects to be discharged to:: Private residence Living Arrangements: Spouse/significant other;Parent(fiancee and mom) Available Help at Discharge: Family;Available 24 hours/day(pt's house with fiancee at night, mom's house during the day) Type of Home: House Home Access: Stairs to enter Entrance Stairs-Rails: None Entrance Stairs-Number of Steps: 3 Home Layout: One level(his home is one level, mom's is 2) Home Equipment: None Additional Comments: wore glasses until his vision got so bad that they did not help.     Prior Function Level of Independence: Needs assistance   Gait / Transfers Assistance Needed: pt reports staggering to the right PTA, but never fell, was "careful".  Worked at [employer], did not drive.  Has 3 children (only one of them, the youngest 31 mo old girl lives with him and his fiancee currently the other two are in [place] with their mother).                Extremity/Trunk Assessment   Upper Extremity Assessment Upper Extremity Assessment: Defer to OT evaluation    Lower Extremity Assessment Lower Extremity Assessment: Overall WFL for tasks assessed    Cervical / Trunk Assessment Cervical / Trunk Assessment: Other exceptions Cervical / Trunk Exceptions: posterior head/neck incision (a bit of forward head due to pain).   Communication   Communication: No difficulties  Cognition Arousal/Alertness: Awake/alert Behavior During Therapy: WFL for tasks assessed/performed Overall Cognitive Status: Within Functional Limits for tasks assessed                                 General Comments: No obvious cognitive deficits, clearly reports timeline of events, history/PLOF.       General Comments General comments (skin integrity, edema, etc.): Started education on falls risk and safety during transitions.          Assessment/Plan    PT Assessment Patient needs continued PT services  PT Problem List Decreased activity  tolerance;Decreased balance;Decreased mobility;Decreased knowledge of use of DME;Decreased knowledge of precautions;Pain       PT Treatment Interventions DME instruction;Gait training;Stair training;Functional mobility training;Therapeutic activities;Therapeutic exercise;Balance training;Patient/family education;Neuromuscular re-education    PT Goals (Current goals can be found in the Care Plan section)  Acute Rehab PT Goals Patient Stated Goal: to get his vision back PT Goal Formulation: With patient/family Time For Goal Achievement: 12/17/19 Potential to Achieve Goals: Good    Frequency Min 3X/week           AM-PAC PT "6 Clicks" Mobility  Outcome Measure Help needed turning from your back to your side while in a flat bed without using bedrails?: A Little Help needed moving from lying on your back to sitting on the side of a flat bed without using bedrails?: A Little Help needed moving to and from a bed to a chair (including a wheelchair)?: A Little Help needed standing up from a chair using your arms (e.g., wheelchair or bedside chair)?: A Little Help needed to walk in hospital room?: A Little Help needed climbing 3-5 steps with a railing? : A Little 6 Click Score: 18    End of Session   Activity Tolerance: Patient limited by pain Patient left: in chair;with call bell/phone within reach;with family/visitor present   PT Visit  Diagnosis: Muscle weakness (generalized) (M62.81);Difficulty in walking, not elsewhere classified (R26.2);Other symptoms and signs involving the nervous system [id number])    Time: [id number] PT Time Calculation (min) (ACUTE ONLY): 41 min   Charges:          Verdene Lennert, PT, DPT  Acute Rehabilitation [phone] pager #[phone] office     PT Evaluation $PT Eval Low Complexity: 1 Low PT Treatments $Gait Training: 23-37 mins       12/03/2019, 2:51 PM

## 2019-12-03 NOTE — Progress Notes (Signed)
Patient ID: Tommy Russell, male   DOB: 1991-02-01, 29 y.o.   MRN: [id number] BP 121/76   Pulse 76   Temp 98.9 F (37.2 C) (Oral)   Resp 12   Ht 5\' 8"  (1.727 m)   Wt 81 kg   SpO2 98%   BMI 27.15 kg/m  Alert and oriented x 4, speech is clear and fluent Perrl, full eom Moving all extremities well Head CT is improved from preop, ventricles smaller. Doing well, will clamp tomorrow morning

## 2019-12-04 NOTE — Progress Notes (Signed)
Physical Therapy Treatment Patient Details Name: Tommy Russell MRN: [id number] DOB: 12-17-1990 Today's Date: 12/04/2019    History of Present Illness 29 y.o. male admitted on 11/28/19 for HA, gait difficulty and incresingly poor vision (dark and blurred).  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  Pt s/p suboccipital craniectomy, cervical laminectomy/duraplasty and placement of external ventricular drain on 11/29/19.  Pt with no other significant PMH.     PT Comments    Pt progressing with gait and mobility, less hand held assist today compared to yesterday and no standing rest breaks due to spinning sensation.  Continued education on fall prevention, using contrast, and home set up.  [facility] handout provided.  Next session either walk with him and tell mom to not hold him or have mom be the only one guarding him.  She needs encouragement to let him do more things that he can do for himself.  PT will continue to follow acutely for safe mobility progression.   Follow Up Recommendations  Outpatient PT;Supervision for mobility/OOB     Equipment Recommendations  None recommended by PT    Recommendations for Other Services   NA     Precautions / Restrictions Precautions Precautions: Other (comment);Fall Precaution Comments: IVD needs to be clamped for mobility.     Mobility  Bed Mobility Overal bed mobility: Needs Assistance Bed Mobility: Supine to Sit     Supine to sit: HOB elevated;Supervision     General bed mobility comments: supervision for safety, HOB ~ 30 degrees  Transfers Overall transfer level: Needs assistance Equipment used: 1 person hand held assist Transfers: Sit to/from Stand Sit to Stand: Min guard         General transfer comment: Min guard assist for safety cues for safe transitions.   Ambulation/Gait Ambulation/Gait assistance: Min assist;+2 physical assistance Gait Distance (Feet): 300 Feet Assistive device: 2 person  hand held assist Gait Pattern/deviations: Step-through pattern;Staggering right Gait velocity: decreased Gait velocity interpretation: <1.8 ft/sec, indicate of risk for recurrent falls General Gait Details: Pt with mildly staggering gait pattern, very light two person hand held assist and could have been one person hand held assist.           Balance Overall balance assessment: Needs assistance Sitting-balance support: Feet supported;No upper extremity supported Sitting balance-Leahy Scale: Fair Sitting balance - Comments: supervision EOB, sitting balance was not challenged   Standing balance support: Single extremity supported Standing balance-Leahy Scale: Fair Standing balance comment: Min guard assist in static standing wide BOS                            Cognition Arousal/Alertness: Awake/alert Behavior During Therapy: WFL for tasks assessed/performed Overall Cognitive Status: (Not specifically tested, conversation normal, processing )                                           General Comments General comments (skin integrity, edema, etc.): Continued education re: fall precautions, creating contrast with light and dark colored or brightly colored tape as needed, turning on light at night if he has to get up to go to the bathroom.       Pertinent Vitals/Pain Pain Assessment: 0-10 Pain Score: 3  Pain Location: posterior neck and head Pain Descriptors / Indicators: Grimacing;Guarding Pain Intervention(s): Limited activity within patient's tolerance;Monitored  during session;Repositioned           PT Goals (current goals can now be found in the care plan section) Acute Rehab PT Goals Patient Stated Goal: to get his vision back Progress towards PT goals: Progressing toward goals    Frequency    Min 3X/week      PT Plan Current plan remains appropriate       AM-PAC PT "6 Clicks" Mobility   Outcome Measure  Help needed turning  from your back to your side while in a flat bed without using bedrails?: None Help needed moving from lying on your back to sitting on the side of a flat bed without using bedrails?: None Help needed moving to and from a bed to a chair (including a wheelchair)?: A Little Help needed standing up from a chair using your arms (e.g., wheelchair or bedside chair)?: A Little Help needed to walk in hospital room?: A Little Help needed climbing 3-5 steps with a railing? : A Little 6 Click Score: 20    End of Session   Activity Tolerance: Patient tolerated treatment well Patient left: in chair;with call bell/phone within reach;with family/visitor present;Other (comment)(in bathroom attempting to have a BM) Nurse Communication: Mobility status PT Visit Diagnosis: Muscle weakness (generalized) (M62.81);Difficulty in walking, not elsewhere classified (R26.2);Other symptoms and signs involving the nervous system [id number])     Time: [id number] PT Time Calculation (min) (ACUTE ONLY): 31 min  Charges:  $Gait Training: 23-37 mins                    Verdene Lennert, PT, DPT  Acute Rehabilitation [phone] pager #[phone] office     12/04/2019, 4:31 PM

## 2019-12-04 NOTE — Progress Notes (Signed)
Drain clamped per Dr. Christella Noa.  Verbal order to unclamp w/ H/A.

## 2019-12-04 NOTE — Progress Notes (Signed)
Patient ID: Tommy Russell, male   DOB: 10-29-1990, 29 y.o.   MRN: [id number] BP 118/68   Pulse 65   Temp 99.2 F (37.3 C) (Oral)   Resp 13   Ht 5\' 8"  (1.727 m)   Wt 81 kg   SpO2 99%   BMI 27.15 kg/m  Alert, and oriented x 4 Speech is clear and fluent Has done well today with drain clamped Perrl, full eom Wound is clean, dry, no signs of infection

## 2019-12-05 ENCOUNTER — Other Ambulatory Visit: Payer: Self-pay | Admitting: Radiation Therapy

## 2019-12-05 MED ORDER — POLYETHYLENE GLYCOL 3350 17 G PO PACK
17.0000 g | PACK | Freq: Every day | ORAL | Status: DC
  Administered 2019-12-05 – 2019-12-07 (×3): 17 g via ORAL
  Filled 2019-12-05 (×3): qty 1

## 2019-12-05 NOTE — Progress Notes (Signed)
Patient ID: Tommy Russell, male   DOB: 24-Jun-1991, 29 y.o.   MRN: [id number] BP 103/86   Pulse 73   Temp 98 F (36.7 C) (Oral)   Resp 13   Ht 5\' 8"  (1.727 m)   Wt 81 kg   SpO2 99%   BMI 27.15 kg/m  Alert and oriented x 4, speech is clear, and fluent perrl Full eom Tongue, uvula midline Moving all extremities well. The ventricular catheter has been clamped without problems. Will order head CT for the morning. Possible ventric removal tomorrow.

## 2019-12-05 NOTE — Progress Notes (Signed)
Physical Therapy Treatment Patient Details Name: Tommy Russell MRN: [id number] DOB: 06/28/1991 Today's Date: 12/05/2019    History of Present Illness 29 y.o. male admitted on 11/28/19 for HA, gait difficulty and incresingly poor vision (dark and blurred).  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  Pt s/p suboccipital craniectomy, cervical laminectomy/duraplasty and placement of external ventricular drain on 11/29/19.  Pt with no other significant PMH.     PT Comments    EVD drain has been clamped since yesterday with mild increase in HA, but manageable with pain meds.  Pt was able to walk around the unit with one person hand held assist with increased challenges of walking backward, and preforming cognitive multi task during gait.  I continue to encourage mom to let him be more independent with bathroom management (let him pull his own underwear down and wipe himself).  I also encouraged more than once a day walks as he is quite constipated and I think more frequent walks would help.  PT will continue to follow acutely for safe mobility progression.     Follow Up Recommendations  Outpatient PT;Supervision for mobility/OOB     Equipment Recommendations  None recommended by PT    Recommendations for Other Services   NA     Precautions / Restrictions Precautions Precautions: Other (comment);Fall Precaution Comments: IVD has been clamped since yesterday    Mobility  Bed Mobility Overal bed mobility: Needs Assistance Bed Mobility: Supine to Sit     Supine to sit: HOB elevated;Supervision     General bed mobility comments: supervision for safety, cues to sit for a moment to make sure he feels ok and do the same once standing.   Transfers Overall transfer level: Needs assistance   Transfers: Sit to/from Stand Sit to Stand: Min guard         General transfer comment: Min guard assist for safety, pt continues to stand with very wide BOS.  Assist mostly to  stabilize once standing and for safety during transitions.     Ambulation/Gait Ambulation/Gait assistance: Min assist Gait Distance (Feet): 300 Feet Assistive device: 1 person hand held assist Gait Pattern/deviations: Step-through pattern;Staggering right Gait velocity: decreased Gait velocity interpretation: <1.8 ft/sec, indicate of risk for recurrent falls General Gait Details: Pt with mildly staggering gait, especially when challenged, very stiff in the head and trunk, encouraged him to start moving his head more when he felt safe (ie in the bed, sitting in the chair), practiced walking backward which seemed quite difficult and took some concentrated effort.  Also did mutitasking cognitive task during gait walking and naming animals for letters of the alphabet.  He did slow, but not as much as I anticipated with the cognitive multitasking.           Balance Overall balance assessment: Needs assistance Sitting-balance support: Feet supported;No upper extremity supported Sitting balance-Leahy Scale: Good Sitting balance - Comments: supervision EOB.  Did not challenge him outside of his BOS   Standing balance support: Single extremity supported Standing balance-Leahy Scale: Fair Standing balance comment: min guard assist in static standing and up to min assist for dynamic tasks like pulling up underwear, etc.                              Cognition Arousal/Alertness: Awake/alert Behavior During Therapy: WFL for tasks assessed/performed  General Comments: Pt doing well at times just a bit slow to process, but this was while walking and doing a cognitive task.          General Comments General comments (skin integrity, edema, etc.): I did have to step in and ask mom to let him manage his own underwear and wipe himself today. I asked pt if he wanted to do a second lap, but his head was hurting a bit more and he knew that OT  was also coming, so he elected to go to the restroom and sit up in the chair.  I asked his mom if he has been walking more than just once with me and she said no, that they were going to go yesterday, but his HA was a bit worse in the evening, so they did not.  I encouraged her to ask for him to be walked in addition to my sessions and she agreed (especially since he is a bit constipated).       Pertinent Vitals/Pain Pain Assessment: 0-10 Pain Score: 5  Pain Location: posterior neck and head Pain Descriptors / Indicators: Grimacing;Guarding Pain Intervention(s): Limited activity within patient's tolerance;Monitored during session;Repositioned           PT Goals (current goals can now be found in the care plan section) Acute Rehab PT Goals Patient Stated Goal: to get his vision back Progress towards PT goals: Progressing toward goals    Frequency    Min 3X/week      PT Plan Current plan remains appropriate       AM-PAC PT "6 Clicks" Mobility   Outcome Measure  Help needed turning from your back to your side while in a flat bed without using bedrails?: None Help needed moving from lying on your back to sitting on the side of a flat bed without using bedrails?: None Help needed moving to and from a bed to a chair (including a wheelchair)?: A Little Help needed standing up from a chair using your arms (e.g., wheelchair or bedside chair)?: A Little Help needed to walk in hospital room?: A Little Help needed climbing 3-5 steps with a railing? : A Little 6 Click Score: 20    End of Session   Activity Tolerance: Patient tolerated treatment well Patient left: in chair;with call bell/phone within reach;with family/visitor present Nurse Communication: Patient requests pain meds PT Visit Diagnosis: Muscle weakness (generalized) (M62.81);Difficulty in walking, not elsewhere classified (R26.2);Other symptoms and signs involving the nervous system [id number])     Time: [id number] PT  Time Calculation (min) (ACUTE ONLY): 40 min  Charges:  $Gait Training: 23-37 mins $Therapeutic Activity: 8-22 mins                    Verdene Lennert, PT, DPT  Acute Rehabilitation [phone] pager #[phone] office     12/05/2019, 4:03 PM

## 2019-12-06 ENCOUNTER — Inpatient Hospital Stay (HOSPITAL_COMMUNITY): Payer: BC Managed Care – PPO

## 2019-12-06 ENCOUNTER — Encounter (HOSPITAL_COMMUNITY): Payer: Self-pay | Admitting: Neurosurgery

## 2019-12-06 MED ORDER — MAGNESIUM CITRATE PO SOLN
1.0000 | Freq: Once | ORAL | Status: AC
  Administered 2019-12-06: 1 via ORAL
  Filled 2019-12-06: qty 296

## 2019-12-06 NOTE — Plan of Care (Signed)
  Problem: Education: Goal: Knowledge of the prescribed therapeutic regimen will improve Outcome: Progressing   

## 2019-12-06 NOTE — Progress Notes (Addendum)
Occupational Therapy  Making steady progress. Working with pt on visual compensatory strategies during functional activities. Family present for education. Ambulated around unit with drain clamped. Continue to recommend follow up with outpt OT.     12/06/19 1500  OT Visit Information  Last OT Received On 12/06/19  Assistance Needed +1  History of Present Illness 29 y.o. male admitted on 11/28/19 for HA, gait difficulty and incresingly poor vision (dark and blurred).  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  Pt s/p suboccipital craniectomy, cervical laminectomy/duraplasty and placement of external ventricular drain on 11/29/19.  Pt with no other significant PMH.   Precautions  Precautions Other (comment);Fall  Precaution Comments IVD remains clamped  Pain Assessment  Pain Assessment 0-10  Pain Score 2  Pain Location posterior neck and head  Pain Descriptors / Indicators Grimacing;Guarding;Discomfort  Pain Intervention(s) Limited activity within patient's tolerance  Cognition  Arousal/Alertness Awake/alert  Behavior During Therapy WFL for tasks assessed/performed;Flat affect  Overall Cognitive Status Within Functional Limits for tasks assessed  General Comments cognition appears to be improving. Mom thingks he is most likely close to baeline  ADL  Eating/Feeding Set up;Sitting  Grooming Supervision/safety;Standing  Toilet Transfer Min guard  Toileting- Water quality scientist and Hygiene Min guard  Functional mobility during ADLs Min guard  General ADL Comments Ambulated to bathroom. Pt wanting to "stand like I always do". Educated pt on using sense of touch on knees to know he is at toilet. Pt able to void without accident  Bed Mobility  Overal bed mobility Needs Assistance  Bed Mobility Supine to Sit  Supine to sit [facility] elevated;Supervision  General bed mobility comments supervision for safety and line management.   Balance  Overall balance assessment Needs  assistance  Sitting-balance support Feet supported;No upper extremity supported  Sitting balance-Leahy Scale Good  Standing balance support No upper extremity supported  Standing balance-Leahy Scale Fair  Standing balance comment close supervision in standing.    Transfers  Overall transfer level Needs assistance  Equipment used None  Transfers Sit to/from Stand  Sit to Stand Supervision  General transfer comment close supervision for safety and line management.   Other Exercises  Other Exercises visual scanning; hand-eye coordiantion in standing with S. Sticky notes tpaed on wall by ptm, using sense of touch to help identify borders of papaer. Bold letters/numbers placed on notes by pt; Pt identified numbers/letters by touch alternating sequences to work on eye-hand coordination in addition to head turns and balance. Family present and verbalzied understanding of tasks. Low vision handout reviewed and provided  OT - End of Session  Equipment Utilized During Treatment Gait belt  Activity Tolerance Patient tolerated treatment well  Patient left with call bell/phone within reach;in bed;with family/visitor present  Nurse Communication Mobility status  OT Assessment/Plan  OT Plan Discharge plan remains appropriate  OT Visit Diagnosis Other abnormalities of gait and mobility (R26.89);Unsteadiness on feet (R26.81);Muscle weakness (generalized) (M62.81);Low vision, both eyes (H54.2);Other symptoms and signs involving cognitive function;Pain  Pain - part of body  (back of neck)  OT Frequency (ACUTE ONLY) Min 3X/week  Follow Up Recommendations Outpatient OT;Supervision/Assistance - 24 hour  OT Equipment 3 in 1 bedside commode  AM-PAC OT "6 Clicks" Daily Activity Outcome Measure (Version 2)  Help from another person eating meals? 3  Help from another person taking care of personal grooming? 3  Help from another person toileting, which includes using toliet, bedpan, or urinal? 3  Help from  another person bathing (  including washing, rinsing, drying)? 3  Help from another person to put on and taking off regular upper body clothing? 3  Help from another person to put on and taking off regular lower body clothing? 3  6 Click Score 18  OT Goal Progression  Progress towards OT goals Progressing toward goals  Acute Rehab OT Goals  Patient Stated Goal to get his vision back  OT Goal Formulation With patient  Time For Goal Achievement 12/17/19  Potential to Achieve Goals Good  ADL Goals  Pt Will Perform Lower Body Bathing with set-up;with supervision;sit to/from stand  Pt Will Perform Lower Body Dressing with supervision;with set-up;sit to/from stand  Pt Will Transfer to Toilet with supervision;ambulating  Pt/caregiver will Perform Home Exercise Program With written HEP provided;With Supervision  Additional ADL Goal #1 Pt will verbalize 3 strategies to compensate for low vision  Additional ADL Goal #2 Pt will independently verbalize 3 strategeis to reduce risk of falls  OT Time Calculation  OT Start Time (ACUTE ONLY) 1430  OT Stop Time (ACUTE ONLY) 1506  OT Time Calculation (min) 36 min  OT General Charges  $OT Visit 1 Visit  OT Treatments  $Self Care/Home Management  8-22 mins  $Therapeutic Activity 8-22 mins  Maurie Boettcher, OT/L   Acute OT Clinical Specialist [place] Pager [phone] Office [phone]

## 2019-12-06 NOTE — Progress Notes (Signed)
Physical Therapy Treatment Patient Details Name: Tommy Russell MRN: [id number] DOB: 11-11-1990 Today's Date: 12/06/2019    History of Present Illness 29 y.o. male admitted on 11/28/19 for HA, gait difficulty and incresingly poor vision (dark and blurred).  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  Pt s/p suboccipital craniectomy, cervical laminectomy/duraplasty and placement of external ventricular drain on 11/29/19.  Pt with no other significant PMH.     PT Comments    Pt able to walk a bit farther and with progressively less assistance every day.  Today we worked on letting go of Tommy Russell, speeding up his gait, and reciprocal arm swing.  He did well and is looking forward to hopefully getting his drain out.  PT will continue to follow acutely for safe mobility progression.  If drain out, practice stairs and higher level gait/balance next session (DGI type activities).    Follow Up Recommendations  Outpatient PT;Supervision for mobility/OOB     Equipment Recommendations  None recommended by PT    Recommendations for Other Services   NA     Precautions / Restrictions Precautions Precautions: Other (comment);Fall Precaution Comments: IVD remains clamped    Mobility  Bed Mobility Overal bed mobility: Needs Assistance Bed Mobility: Supine to Sit     Supine to sit: HOB elevated;Supervision     General bed mobility comments: supervision for safety and line management.   Transfers Overall transfer level: Needs assistance Equipment used: None Transfers: Sit to/from Stand Sit to Stand: Supervision         General transfer comment: close supervision for safety and line management.   Ambulation/Gait Ambulation/Gait assistance: Min guard;Supervision Gait Distance (Feet): 360 Feet Assistive device: None Gait Pattern/deviations: Step-through pattern;Staggering right Gait velocity: decreased Gait velocity interpretation: <1.8 ft/sec, indicate of risk for  recurrent falls General Gait Details: Pt with only one larger stagger for which he self corrected, started as min guard progressed to close supervision working on increasing gait speed and reciprocal arm swing.           Balance Overall balance assessment: Needs assistance Sitting-balance support: Feet supported;No upper extremity supported Sitting balance-Leahy Scale: Good     Standing balance support: No upper extremity supported Standing balance-Leahy Scale: Fair Standing balance comment: close supervision in standing.                              Cognition Arousal/Alertness: Awake/alert Behavior During Therapy: WFL for tasks assessed/performed;Flat affect(a bit more flat today due to situation with his daughter)                                   General Comments: Not specifically tested, a bit more flat today due to situation with his daughter's health, but rightfully so.              Pertinent Vitals/Pain Pain Assessment: 0-10 Pain Score: 2  Pain Location: posterior neck and head Pain Descriptors / Indicators: Grimacing;Guarding Pain Intervention(s): Limited activity within patient's tolerance;Monitored during session;Repositioned           PT Goals (current goals can now be found in the care plan section) Acute Rehab PT Goals Patient Stated Goal: to get his vision back Progress towards PT goals: Progressing toward goals    Frequency    Min 3X/week      PT Plan Current plan remains appropriate  AM-PAC PT "6 Clicks" Mobility   Outcome Measure  Help needed turning from your back to your side while in a flat bed without using bedrails?: None Help needed moving from lying on your back to sitting on the side of a flat bed without using bedrails?: None Help needed moving to and from a bed to a chair (including a wheelchair)?: None Help needed standing up from a chair using your arms (e.g., wheelchair or bedside chair)?:  None Help needed to walk in hospital room?: A Little Help needed climbing 3-5 steps with a railing? : A Little 6 Click Score: 22    End of Session   Activity Tolerance: Patient tolerated treatment well Patient left: in bed;Other (comment)(seated EOB) Nurse Communication: Mobility status PT Visit Diagnosis: Muscle weakness (generalized) (M62.81);Difficulty in walking, not elsewhere classified (R26.2);Other symptoms and signs involving the nervous system [id number])     Time: [id number] PT Time Calculation (min) (ACUTE ONLY): 38 min  Charges:  $Gait Training: 23-37 mins $Therapeutic Activity: 8-22 mins                    Tommy Russell, PT, DPT  Acute Rehabilitation [phone] pager #[phone] office     12/06/2019, 1:00 PM

## 2019-12-07 MED ORDER — HYDROCODONE-ACETAMINOPHEN 5-325 MG PO TABS: 1.0000 | ORAL_TABLET | Freq: Four times a day (QID) | ORAL | 0 refills | Status: DC | PRN

## 2019-12-07 NOTE — Discharge Instructions (Signed)
Craniotomy °Care After °Please read the instructions outlined below and refer to this sheet in the next few weeks. These discharge instructions provide you with general information on caring for yourself after you leave the hospital. Your surgeon may also give you specific instructions. While your treatment has been planned according to the most current medical practices available, unavoidable complications occasionally occur. If you have any problems or questions after discharge, please call your surgeon. °Although there are many types of brain surgery, recovery following craniotomy (surgical opening of the skull) is much the same for each. However, recovery depends on many factors. These include the type and severity of brain injury and the type of surgery. It also depends on any nervous system function problems (neurological deficits) before surgery. If the craniotomy was done for cancer, chemotherapy and radiation could follow. You could be in the hospital from 5 days to a couple weeks. This depends on the type of surgery, findings, and whether there are complications. °HOME CARE INSTRUCTIONS  °· It is not unusual to hear a clicking noise after a craniotomy, the plates and screws used to attach the bone flap can sometimes cause this. It is a normal occurrence if this does happen °· Do not drive for 10 days after the operation °· Your scalp may feel spongy for a while, because of fluid under it. This will gradually get better. Occasionally, the surgeon will not replace the bone that was removed to access the brain. If there is a bony defect, the surgeon will ask you to wear a helmet for protection. This is a discussion you should have with your surgeon prior to leaving the hospital (discharge). °· Numbness may persist in some areas of your scalp. °· Take all medications as directed. Sometimes steroids to control swelling are prescribed. Anticonvulsants to prevent seizures may also be given. Do not use alcohol,  other drugs, or medications unless your surgeon says it is OK. °· Keep the wound dry and clean. The wound may be washed gently with soap and water. Then, you may gently blot or dab it dry, without rubbing. Do not take baths, use swimming pools or hot tubs for 10 days, or as instructed by your caregiver. It is best to wait to see you surgeon at your first postoperative visit, and to get directions at that time. °· Only take over-the-counter or prescription medicines for pain, discomfort, or fever as directed by your caregiver. °· You may continue your normal diet, as directed. °· Walking is OK for exercise. Wait at least 3 months before you return to mild, non-contact sports or as your surgeon suggests. Contact sports should be avoided for at least 1 year, unless your surgeon says it is OK. °· If you are prescribed steroids, take them exactly as prescribed. If you start having a decrease in nervous system functions (neurological deficits) and headaches as the dose of steroids is reduced, tell your surgeon right away. °· When the anticonvulsant prescription is finished you no longer need to take it. °SEEK IMMEDIATE MEDICAL CARE IF:  °· You develop nausea, vomiting, severe headaches, confusion, or you have a seizure. °· You develop chest pain, a stiff neck, or difficulty breathing. °· There is redness, swelling, or increasing pain in the wound or pin insertion sites. °· You have an increase in swelling or bruising around the eyes. °· There is drainage or pus coming from the wound. °· You have an oral temperature above 102° F (38.9° C), not controlled by medicine. °·   You notice a foul smell coming from the wound or dressing. °· The wound breaks open (edges not staying together) after the stitches have been removed. °· You develop dizziness or fainting while standing. °· You develop a rash. °· You develop any reaction or side effects to the medications given. °Document Released: 10/19/2005 Document Revised: 10/11/2011  Document Reviewed: 07/28/2009 °ExitCare® Patient Information ©2013 [employer]. ° °

## 2019-12-07 NOTE — TOC Initial Note (Signed)
Transition of Care [facility]) - Initial/Assessment Note    Patient Details  Name: Tommy Russell MRN: [id number] Date of Birth: 1991/03/24  [facility] CM/SW Contact:    Ella Bodo, RN Phone Number: 12/07/2019, 2:34 PM  Clinical Narrative:  29 y.o. male admitted on 11/28/19 for HA, gait difficulty and incresingly poor vision (dark and blurred).  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  Pt s/p suboccipital craniectomy, cervical laminectomy/duraplasty and placement of external ventricular drain on 11/29/19. PTA, pt independent and living with fiance.  PT/OT recommending OP rehab, and pt agreeable to follow up services.  Fiance and mother to provide 24h supervision at discharge.  Will refer to [facility] for OP PT/OT follow up.                   Expected Discharge Plan: OP Rehab Barriers to Discharge: Continued Medical Work up   Patient Goals and CMS Choice        Expected Discharge Plan and Services Expected Discharge Plan: OP Rehab   Discharge Planning Services: CM Consult   Living arrangements for the past 2 months: Single Family Home                                      Prior Living Arrangements/Services Living arrangements for the past 2 months: Single Family Home Lives with:: Significant Other Patient language and need for interpreter reviewed:: Yes Do you feel safe going back to the place where you live?: Yes      Need for Family Participation in Patient Care: Yes (Comment) Care giver support system in place?: Yes (comment)   Criminal Activity/Legal Involvement Pertinent to Current Situation/Hospitalization: No - Comment as needed  Activities of Daily Living Home Assistive Devices/Equipment: Eyeglasses ADL Screening (condition at time of admission) Patient's cognitive ability adequate to safely complete daily activities?: Yes Is the patient deaf or have difficulty hearing?: No Does the patient have difficulty seeing, even  when wearing glasses/contacts?: Yes Does the patient have difficulty concentrating, remembering, or making decisions?: No Patient able to express need for assistance with ADLs?: Yes Does the patient have difficulty dressing or bathing?: Yes Independently performs ADLs?: Yes (appropriate for developmental age) Does the patient have difficulty walking or climbing stairs?: No Weakness of Legs: None Weakness of Arms/Hands: None  Permission Sought/Granted                  Emotional Assessment Appearance:: Appears stated age Attitude/Demeanor/Rapport: Engaged Affect (typically observed): Accepting Orientation: : Oriented to Self, Oriented to Place, Oriented to  Time, Oriented to Situation      Admission diagnosis:  Obstructive hydrocephalus (HCC) [G91.1] 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 Pilocytic astrocytoma of cerebellum ([place]) 11/29/2019   PCP:  Patient, No Pcp Per Pharmacy:   [id number] - , [place] [address] [place] [address] Phone: [phone] Fax: [phone]     Social Determinants of Health (SDOH) Interventions    Readmission Risk Interventions No flowsheet data found.  Reinaldo Raddle, RN, BSN  Trauma/Neuro ICU Case Manager [phone]

## 2019-12-07 NOTE — Progress Notes (Signed)
Physical Therapy Treatment Patient Details Name: Tommy Russell MRN: [id number] DOB: 05/04/1991 Today's Date: 12/07/2019    History of Present Illness 29 y.o. male admitted on 11/28/19 for HA, gait difficulty and incresingly poor vision (dark and blurred).  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  Pt s/p suboccipital craniectomy, cervical laminectomy/duraplasty and placement of external ventricular drain on 11/29/19.  Pt with no other significant PMH.     PT Comments    Pt progressing well with gait and mobility.  DGI was 15/24 indicating high fall risk.  [name], his fiancee, assisted him in stair training and we reviewed fall precaution information with pt, fiancee, and mother.  I added some static balance activities and head turning activities to challenge him today.  He remains appropriate for OP PT follow up at discharge.  PT will continue to follow acutely for safe mobility progression.   Follow Up Recommendations  Outpatient PT;Supervision for mobility/OOB     Equipment Recommendations  None recommended by PT    Recommendations for Other Services   NA     Precautions / Restrictions Precautions Precautions: Fall;Other (comment) Precaution Comments: significant visual impairments    Mobility  Bed Mobility Overal bed mobility: Independent                Transfers Overall transfer level: Needs assistance Equipment used: None Transfers: Sit to/from Stand Sit to Stand: Supervision         General transfer comment: supervision for safety  Ambulation/Gait Ambulation/Gait assistance: Supervision Gait Distance (Feet): 500 Feet Assistive device: None Gait Pattern/deviations: Step-through pattern;Staggering right Gait velocity: decreased Gait velocity interpretation: 1.31 - 2.62 ft/sec, indicative of limited community ambulator General Gait Details: Pt continues to have staggering gait pattern when challenged, but is doing better with faster speed and  reciprocal arm swing.    Stairs Stairs: Yes Stairs assistance: Min assist Stair Management: No rails;Step to pattern;Forwards Number of Stairs: 3 General stair comments: Flonnie Hailstone assisted pt on stairs with hand held assist, we discussed using contrasting tape on the stairs at home to [employer] in seeing the edge of the step.  Cues for safety and safe guarding techniques.           Balance Overall balance assessment: Needs assistance Sitting-balance support: Feet supported;No upper extremity supported Sitting balance-Leahy Scale: Good     Standing balance support: No upper extremity supported Standing balance-Leahy Scale: Good   Single Leg Stance - Right Leg: 10(R more difficult) Single Leg Stance - Left Leg: 10 Tandem Stance - Right Leg: 30 Tandem Stance - Left Leg: 30 [place] - Eyes Opened: 30(with horizontal head shakes, encourage him to go faster)       Standardized Balance Assessment Standardized Balance Assessment : Dynamic Gait Index   Dynamic Gait Index Level Surface: Mild Impairment Change in Gait Speed: Mild Impairment Gait with Horizontal Head Turns: Mild Impairment Gait with Vertical Head Turns: Mild Impairment Gait and Pivot Turn: Mild Impairment Step Over Obstacle: Mild Impairment Step Around Obstacles: Mild Impairment Steps: Moderate Impairment Total Score: 15      Cognition Arousal/Alertness: Awake/alert Behavior During Therapy: WFL for tasks assessed/performed;Flat affect Overall Cognitive Status: Within Functional Limits for tasks assessed(not specifically tested)                                           General Comments General comments (  skin integrity, edema, etc.): Continued fall precaution eduction, mom and Celesta Gentile were both present and engaged in entire session and for education.       Pertinent Vitals/Pain Pain Assessment: 0-10 Pain Score: 2  Pain Location: posterior neck and head Pain Descriptors /  Indicators: Grimacing;Guarding;Discomfort           PT Goals (current goals can now be found in the care plan section) Progress towards PT goals: Progressing toward goals    Frequency    Min 3X/week      PT Plan Current plan remains appropriate       AM-PAC PT "6 Clicks" Mobility   Outcome Measure  Help needed turning from your back to your side while in a flat bed without using bedrails?: None Help needed moving from lying on your back to sitting on the side of a flat bed without using bedrails?: None Help needed moving to and from a bed to a chair (including a wheelchair)?: None Help needed standing up from a chair using your arms (e.g., wheelchair or bedside chair)?: None Help needed to walk in hospital room?: None Help needed climbing 3-5 steps with a railing? : A Little 6 Click Score: 23    End of Session   Activity Tolerance: Patient tolerated treatment well Patient left: in bed;Other (comment)(seated EOB) Nurse Communication: Mobility status PT Visit Diagnosis: Muscle weakness (generalized) (M62.81);Difficulty in walking, not elsewhere classified (R26.2);Other symptoms and signs involving the nervous system [id number])     Time: 1100-1144 PT Time Calculation (min) (ACUTE ONLY): 44 min  Charges:  $Gait Training: 23-37 mins $Self Care/Home Management: [place], PT, DPT  Acute Rehabilitation [phone] pager #[phone] office               12/07/2019, 5:12 PM

## 2019-12-10 ENCOUNTER — Inpatient Hospital Stay: Payer: BC Managed Care – PPO | Attending: Neurosurgery

## 2019-12-10 DIAGNOSIS — Z9889 Other specified postprocedural states: Secondary | ICD-10-CM | POA: Insufficient documentation

## 2019-12-10 DIAGNOSIS — F17210 Nicotine dependence, cigarettes, uncomplicated: Secondary | ICD-10-CM | POA: Insufficient documentation

## 2019-12-10 DIAGNOSIS — D497 Neoplasm of unspecified behavior of endocrine glands and other parts of nervous system: Secondary | ICD-10-CM | POA: Insufficient documentation

## 2019-12-10 LAB — SURGICAL PATHOLOGY

## 2019-12-11 ENCOUNTER — Encounter (HOSPITAL_COMMUNITY): Payer: Self-pay | Admitting: Internal Medicine

## 2019-12-17 ENCOUNTER — Inpatient Hospital Stay: Payer: BC Managed Care – PPO

## 2019-12-18 ENCOUNTER — Ambulatory Visit: Payer: BC Managed Care – PPO | Admitting: Internal Medicine

## 2019-12-18 ENCOUNTER — Ambulatory Visit: Payer: Self-pay | Admitting: Neurology

## 2019-12-19 NOTE — Discharge Summary (Signed)
Physician Discharge Summary  Patient ID: Tommy Russell MRN: [id number] DOB/AGE: 1991-03-01 29 y.o.  Admit date: 11/28/2019 Discharge date: 12/19/2019  Admission Diagnoses:Brain tumor, posterior fossa  Discharge Diagnoses:  Active Problems:   Pilocytic astrocytoma of cerebellum [facility])   Discharged Condition: good  Hospital Course: Mr. Corella presented to the ED with headaches, worsening coordination, deteriorating eyesight. Head CT and MRI brain revealed a cystic mass in the cerebellum with an enhancing mural nodule. He had mild hydrocephalus, and history of emesis. Florid papilledema bilaterally. He was taken to the operating room for a suboccipital craniotomy and resection of the mass and mural nodule. Post operatively I was able to wean him from a ventricular catheter without difficulty. At discharge he is ambulating, voiding,and tolerating a regular diet. His wound is clean, dry, without signs of infection.  Treatments: surgery: Suboccipital craniotomy and ventricular catheter placement for tumor resection.  Discharge Exam: Blood pressure (!) 114/59, pulse 95, temperature 98.6 F (37 C), temperature source Oral, resp. rate 16, height 5\' 8"  (1.727 m), weight 81 kg, SpO2 99 %. General appearance: alert, cooperative, appears stated age and no distress Neurologic: Mental status: Alert, oriented, thought content appropriate Cranial nerves:  I: smell Not tested  II: visual acuity  OS: poor    OD: poor  II: visual fields Full to confrontation  II: pupils Equal, round, reactive to light  III,VII: ptosis None  III,IV,VI: extraocular muscles  Full ROM  V: mastication Normal  V: facial light touch sensation  Normal  V,VII: corneal reflex  Present  VII: facial muscle function - upper  Normal  VII: facial muscle function - lower Normal  VIII: hearing Not tested  IX: soft palate elevation  Normal  IX,X: gag reflex Present  XI: trapezius strength  5/5  XI: sternocleidomastoid strength 5/5   XI: neck flexion strength  5/5  XII: tongue strength  Normal  , II: visual acuity reduced bilaterally, II: visual field restricted peripherally Motor: grossly normal Coordination: truncal ataxia present Gait: Abnormal  Disposition: Discharge disposition: 01-Home or Self Care      [place]LL[facility] [place] Discharge Instructions    Ambulatory referral to Occupational Therapy   Complete by: As directed    Ambulatory referral to Physical Therapy   Complete by: As directed      Allergies as of 12/07/2019   No Known Allergies     Medication List    TAKE these medications   HYDROcodone-acetaminophen 5-325 MG tablet Commonly known as: NORCO/VICODIN Take 1-2 tablets by mouth every 6 (six) hours as needed for moderate pain.   ibuprofen 200 MG tablet Commonly known as: ADVIL Take 200 mg by mouth every 6 (six) hours as needed for headache or mild pain.      Follow-up Information    [facility].   Specialty: Rehabilitation Why: Outpatient physical and occupational therapy.  Rehab center will call you for an appointment, or you may call to schedule.  Contact information: [address] [place] [id number] [place] [id number] [phone]       Ashok Pall, MD Follow up.   Specialty: Neurosurgery Why: call the office to come in for staple removal next week Contact information: [address] [place] [address] [place] [address] [phone]           Signed: Ashok Pall 12/19/2019, 5:26 PM

## 2019-12-28 ENCOUNTER — Ambulatory Visit: Payer: BC Managed Care – PPO | Admitting: Internal Medicine

## 2019-12-28 ENCOUNTER — Inpatient Hospital Stay (HOSPITAL_BASED_OUTPATIENT_CLINIC_OR_DEPARTMENT_OTHER): Payer: BC Managed Care – PPO | Admitting: Internal Medicine

## 2019-12-28 ENCOUNTER — Other Ambulatory Visit: Payer: Self-pay

## 2019-12-28 VITALS — BP 123/78 | HR 84 | Temp 98.1°F | Resp 20 | Ht 68.0 in | Wt 201.4 lb

## 2019-12-28 DIAGNOSIS — D497 Neoplasm of unspecified behavior of endocrine glands and other parts of nervous system: Secondary | ICD-10-CM | POA: Diagnosis present

## 2019-12-28 DIAGNOSIS — F17210 Nicotine dependence, cigarettes, uncomplicated: Secondary | ICD-10-CM

## 2019-12-28 DIAGNOSIS — Z9889 Other specified postprocedural states: Secondary | ICD-10-CM | POA: Diagnosis not present

## 2019-12-28 NOTE — Progress Notes (Signed)
[facility] [place], [place] [address] [phone]   New Patient Evaluation  Date of Service: 12/28/19 Patient Name: Tommy Russell Patient MRN: [id number] Patient DOB: 1991-06-27 Provider: Ventura Sellers, MD  Identifying Statement:  Tommy Russell is a 29 y.o. 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    CNS Oncologic History 11/29/19: Sub-occipital craniotomy, resection by Dr. Christella Noa and Dr. Marcello Moores  Biomarkers:  MGMT Unknown.  IDH 1/2 Unknown.  EGFR Unknown  TERT Unknown   History of Present Illness: The patient's records from the referring physician were obtained and reviewed and the patient interviewed to confirm this HPI.  Rolla Plate presented to medical attention in April 2021 after several weeks of progressive blurry vision.  Several days before presentation, he began to experience severe morning headaches, different from typical tension headaches.  In addition, his walking and balance became somewhat impaired, although he never required any assist or had any falls.  CNS imaging demonstrated large cystic mass in the posterior fossa with accompanying hydrocephalus.  Mass was resected on 11/29/19 and accompanied by EVD placement.  Drain was removed after several days, and patient improved clinically except for visual impairment.  At this time he feels back to normal physically, vision is still very blurry.    Medications: Current Outpatient Medications on File Prior to Visit  Medication Sig Dispense Refill  . acetaminophen (TYLENOL) 500 MG tablet Take 1,000 mg by mouth every 6 (six) hours as needed.    Marland Kitchen ibuprofen (ADVIL) 600 MG tablet Take 600 mg by mouth every 8 (eight) hours as needed.     No current facility-administered medications on file prior to visit.    Allergies: No Known Allergies Past Medical History: No past medical history on  file. Past Surgical History:  Past Surgical History:  Procedure Laterality Date  . SUBOCCIPITAL CRANIECTOMY CERVICAL LAMINECTOMY N/A 11/29/2019   Procedure: SUBOCCIPITAL CRANIECTOMY CERVICAL LAMINECTOMY/DURAPLASTY FOR RESECTION OF MASSS AND PLACEMENT OF EXTERNAL VENTRICULAR DRAIN ;  Surgeon: Vallarie Mare, MD;  Location: [facility];  Service: Neurosurgery;  Laterality: N/A;   Social History:  Social History   Socioeconomic History  . Marital status: Single    Spouse name: Not on file  . Number of children: Not on file  . Years of education: Not on file  . Highest education level: Not on file  Occupational History  . Not on file  Tobacco Use  . Smoking status: Current Every Day Smoker  . Smokeless tobacco: Never Used  Substance and Sexual Activity  . Alcohol use: Yes  . Drug use: Never  . Sexual activity: Not on file  Other Topics Concern  . Not on file  Social History Narrative  . Not on file   Social Determinants of Health   Financial Resource Strain:   . Difficulty of Paying Living Expenses:   Food Insecurity:   . Worried About Charity fundraiser in the Last Year:   . Arboriculturist in the Last Year:   Transportation Needs:   . Film/video editor (Medical):   Marland Kitchen Lack of Transportation (Non-Medical):   Physical Activity:   . Days of Exercise per Week:   . Minutes of Exercise per Session:   Stress:   . Feeling of Stress :   Social Connections:   . Frequency of Communication with Friends and Family:   . Frequency of Social  Gatherings with Friends and Family:   . Attends Religious Services:   . Active Member of Clubs or Organizations:   . Attends Archivist Meetings:   Marland Kitchen Marital Status:   Intimate Partner Violence:   . Fear of Current or Ex-Partner:   . Emotionally Abused:   Marland Kitchen Physically Abused:   . Sexually Abused:    Family History: No family history on file.  Review of Systems: Constitutional: Doesn't report fevers, chills or abnormal weight  loss Eyes: Doesn't report blurriness of vision Ears, nose, mouth, throat, and face: Doesn't report sore throat Respiratory: Doesn't report cough, dyspnea or wheezes Cardiovascular: Doesn't report palpitation, chest discomfort  Gastrointestinal:  Doesn't report nausea, constipation, diarrhea GU: Doesn't report incontinence Skin: Doesn't report skin rashes Neurological: Per HPI Musculoskeletal: Doesn't report joint pain Behavioral/Psych: Doesn't report anxiety  Physical Exam: Vitals:   12/28/19 0905  BP: 123/78  Pulse: 84  Resp: 20  Temp: 98.1 F (36.7 C)  SpO2: 100%   KPS: 80. General: Alert, cooperative, pleasant, in no acute distress Head: Normal EENT: No conjunctival injection or scleral icterus.  Lungs: Resp effort normal Cardiac: Regular rate Abdomen: Non-distended abdomen Skin: No rashes cyanosis or petechiae. Extremities: No clubbing or edema  Neurologic Exam: Mental Status: Awake, alert, attentive to examiner. Oriented to self and environment. Language is fluent with intact comprehension.  Cranial Nerves: Visual acuity is impaired in both eyes. Visual fields are full. Extra-ocular movements intact. No ptosis. Face is symmetric Motor: Tone and bulk are normal. Power is full in both arms and legs. Reflexes are symmetric, no pathologic reflexes present.  Sensory: Intact to light touch Gait: Independent, impaired tandem.   Labs: I have reviewed the data as listed    Component Value Date/Time   NA 140 12/01/2019 0226   K 4.2 12/01/2019 0226   CL 107 12/01/2019 0226   CO2 24 12/01/2019 0226   GLUCOSE 145 (H) 12/01/2019 0226   BUN 10 12/01/2019 0226   CREATININE 0.84 12/01/2019 0226   CALCIUM 9.2 12/01/2019 0226   PROT 7.6 11/28/2019 2251   ALBUMIN 4.4 11/28/2019 2251   AST 15 11/28/2019 2251   ALT 13 11/28/2019 2251   ALKPHOS 68 11/28/2019 2251   BILITOT 1.0 11/28/2019 2251   GFRNONAA >60 12/01/2019 0226   GFRAA >60 12/01/2019 0226   Lab Results   Component Value Date   WBC 6.5 11/29/2019   NEUTROABS 3.7 11/29/2019   HGB 14.5 11/29/2019   HCT 43.3 11/29/2019   MCV 95.0 11/29/2019   PLT 315 11/29/2019    Imaging:  CT HEAD WO CONTRAST  Result Date: 12/06/2019 CLINICAL DATA:  Evaluate ventricles; brain mass or lesion. EXAM: CT HEAD WITHOUT CONTRAST TECHNIQUE: Contiguous axial images were obtained from the base of the skull through the vertex without intravenous contrast. COMPARISON:  Prior head CT examinations 12/03/2019 and earlier, brain MRI 11/29/2019. FINDINGS: Brain: Evolving sequela of prior suboccipital craniotomy and resection of a posterior fossa mass. Redemonstrated resection cavity within the paramedian left cerebellum. An adjacent cystic space has decreased in size, now measuring 2.9 x 2.6 x 2.2 cm. This may reflect a portion of the resection cavity or residuum of a previously demonstrated peritumoral cyst. Persistent small focus of pneumocephalus and unchanged small volume hemorrhage at the resection site. There is persistent, although decreased effacement of the fourth ventricle. Unchanged position of a right occipital approach ventricular catheter, again coursing through the occipital horn and atrium of the right lateral ventricle and terminating in  the region of the right thalamus. Unchanged small amount of hemorrhage in the right occipital lobe along the catheter tract and within the occipital horn of the right lateral ventricle. Mild lateral and third ventriculomegaly has slightly increased as compared to prior examination 10/03/2019. For instance, the third ventricle now measures 6 mm in with (previously 3 mm). No demarcated cortical infarct. Vascular: No hyperdense vessel. Skull: Suboccipital craniotomy with mild scalp soft tissue swelling. Overlying skin staples Sinuses/Orbits: Visualized orbits show no acute finding. Trace scattered paranasal sinus mucosal thickening. No significant mastoid effusion. IMPRESSION: 1. Mild  lateral and third ventriculomegaly has slightly increased as compared to prior head CT 12/03/2019. Unchanged position of a right occipital approach ventricular catheter. 2. Evolving postoperative changes from recent posterior fossa mass resection as described. Decreased posterior fossa mass effect with persistent although decreased effacement of the fourth ventricle. 3. Unchanged small volume hemorrhage along the ventricular catheter tract and within the occipital horn of the right lateral ventricle. Electronically Signed   By: Kellie Simmering DO   On: 12/06/2019 08:12   CT HEAD WO CONTRAST  Result Date: 11/29/2019 CLINICAL DATA:  Dizziness and loss of vision with onset 2 weeks ago. EXAM: CT HEAD WITHOUT CONTRAST TECHNIQUE: Contiguous axial images were obtained from the base of the skull through the vertex without intravenous contrast. COMPARISON:  None. FINDINGS: Brain: No evidence of acute infarction, hemorrhage or extra-axial collection. There is moderate to marked severity dilatation of the lateral ventricles and third ventricle. A 5.9 cm x 5.0 cm x 4.1 cm predominately cystic appearing area is seen within the cerebellum, along the midline. There is marked severity mass effect, and subsequent compression, on the fourth ventricle which is anterior to this region (axial CT images 10 through 13, CT series number 3). Vascular: No hyperdense vessel or unexpected calcification. Skull: Normal. Negative for fracture or focal lesion. Sinuses/Orbits: No acute finding. Other: None. IMPRESSION: 1. Large, predominately cystic-appearing area within the cerebellum, with mass effect on the fourth ventricle and subsequent obstructive hydrocephalus. An anterior soft tissue component cannot be excluded. MRI correlation is recommended. 2. Moderate to marked severity dilatation of the lateral ventricles and third ventricle. 3. No evidence of acute infarction, hemorrhage or extra-axial collection. Electronically Signed   By:  Virgina Norfolk M.D.   On: 11/29/2019 00:29   Stealth CT Head W Contrast  Result Date: 11/29/2019 CLINICAL DATA:  Brain mass or lesion.  Additional provided: Preop. EXAM: CT HEAD WITH CONTRAST TECHNIQUE: Contiguous axial images were obtained from the base of the skull through the vertex with intravenous contrast. CONTRAST:  86m OMNIPAQUE IOHEXOL 300 MG/ML  SOLN COMPARISON:  Brain MRI 11/29/2019, head CT 11/29/2019. FINDINGS: Brain: Head CT with and without contrast was performed for the purposes of intraoperative navigation. Again demonstrated is a cystic mass centered within the mid cerebellum. The cystic component of the mass is unchanged in size as compared to brain MRI performed earlier the same day 11/29/2019. The cystic component again measures 5.8 x 4.8 x 4.2 cm (AP x TV x CC). Redemonstrated 12 mm enhancing mural nodular component along the posterior aspect of the cyst (series 7, image 20) (series 6, image 32). Also unchanged, there is severe posterior fossa mass effect with severe effacement of the fourth ventricle and ventral displacement of the brainstem. Redemonstrated cerebellar tonsillar herniation which is incompletely imaged on the current study. Unchanged obstructive hydrocephalus with lateral and third ventriculomegaly. Redemonstrated periventricular hypodensity consistent with transependymal flow of CSF. There is no acute  intracranial hemorrhage. No demarcated cortical infarct. No extra-axial fluid collection. No supratentorial midline shift. Vascular: No hyperdense vessel is identified on precontrast imaging. Expected enhancement of the proximal large arterial vessels and dural venous sinuses. Skull: Normal. Negative for fracture or focal lesion. Sinuses/Orbits: Visualized orbits show no acute finding. Mild scattered paranasal sinus mucosal thickening. No significant mastoid effusion. IMPRESSION: 1. Pre and post-contrast head CT performed for the purposes of intraoperative navigation. 2.  Unchanged 5.8 x 4.8 x 4.2 cm cystic mass with enhancing mural nodule centered within the mid cerebellum. Please refer to brain MRI performed earlier the same day for differential considerations. 3. Redemonstrated severe posterior fossa mass effect with severe effacement of the fourth ventricle, anterior displacement of the midbrain and cerebellar tonsillar herniation. Unchanged obstructive lateral and third ventriculomegaly with transependymal flow of CSF. Electronically Signed   By: Kellie Simmering DO   On: 11/29/2019 13:35   CT HEAD W & WO CONTRAST  Result Date: 12/03/2019 CLINICAL DATA:  Assessment of ventricular size following tumor resection. EXAM: CT HEAD WITHOUT AND WITH CONTRAST TECHNIQUE: Contiguous axial images were obtained from the base of the skull through the vertex without and with intravenous contrast CONTRAST:  45m OMNIPAQUE IOHEXOL 300 MG/ML  SOLN COMPARISON:  11/29/2019 FINDINGS: Brain: Sequelae of interval suboccipital craniotomy are identified. There is a resection cavity in the midline of the cerebellum containing a small amount of layering acute blood products, and there is also small volume pneumocephalus. A small amount of extra-axial blood is also noted subjacent to the craniotomy and along the tentorium. Posterior fossa mass effect has decreased. A right occipital approach ventriculostomy catheter has been placed and courses through the occipital horn and atrium of the right lateral ventricle terminating at the posterior aspect of the right thalamus. Small volume acute blood products are noted in the right occipital lobe along the course of the catheter. There is mild residual lateral and third ventriculomegaly, greatly improved from the prior study. Periventricular white matter hypoattenuation has largely resolved. No acute large territory infarct or midline shift is evident. Vascular: No hyperdense vessel. Skull: Suboccipital craniotomy with mild scalp soft tissue swelling and with skin  staples in place. Sinuses/Orbits: The visualized paranasal sinuses and mastoid air cells are clear. The orbits are unremarkable. Other: None. IMPRESSION: 1. Postoperative changes from cerebellar tumor resection with decreased posterior fossa mass effect. 2. Decreased hydrocephalus following ventriculostomy catheter placement. Electronically Signed   By: ALogan BoresM.D.   On: 12/03/2019 15:24   MR Brain W and Wo Contrast  Result Date: 11/29/2019 CLINICAL DATA:  Initial evaluation for acute migraine headaches for 3 weeks, visual changes. EXAM: MRI HEAD WITHOUT AND WITH CONTRAST TECHNIQUE: Multiplanar, multiecho pulse sequences of the brain and surrounding structures were obtained without and with intravenous contrast. CONTRAST:  9.726mGADAVIST GADOBUTROL 1 MMOL/ML IV SOLN COMPARISON:  Prior head CT from earlier the same day. FINDINGS: Brain: Cystic mass positioned within the central aspect of the cerebellum measures 5.4 x 4.8 x 4.2 cm (AP by transverse by craniocaudad). Lesion is well-circumscribed with minimal surrounding edema/FLAIR signal intensity. The cystic component of this lesion follows CSF signal on all pulse sequences. There is an enhancing mural nodule at the posterior aspect of this lesion just to the left of midline measuring 12 x 6 x 13 mm (series 10, image 17). No other significant internal complexity, solid component, or enhancement. Secondary mass effect on the adjacent fourth ventricle which is almost entirely effaced. Associated obstructive hydrocephalus with marked  dilatation of the lateral and third ventricles. The dilated third ventricle descends to compressed the suprasellar cistern. Periventricular T2/FLAIR hyperintensity compatible with transependymal flow of CSF. Additionally, there is evidence for transtentorial herniation with the cerebellar tonsils beaked and extending up to 17 mm through the foramen magnum. Mass effect on the brainstem which is flattened and somewhat displaced  anteriorly. Diffuse cerebral edema seen elsewhere throughout the brain. Diffuse FLAIR signal intensity with leptomeningeal enhancement seen along the cortical sulci compatible with congestion. No other mass lesion or abnormal enhancement. No evidence for acute or subacute infarct. No encephalomalacia to suggest chronic cortical infarction. Small amount of susceptibility artifact noted associated with the mural nodule within the cerebellar lesion. No other evidence for acute or chronic intracranial hemorrhage. No made of an empty sella with flattening and compression of the pituitary gland, likely due to of cerebral edema and hydrocephalus. Vascular: Major intracranial vascular flow voids are maintained. Skull and upper cervical spine: Bone marrow signal intensity within normal limits. No scalp soft tissue abnormality. No hydromyelia within the visualized upper cervical spine. Sinuses/Orbits: Increased CSF density seen along the optic nerve sheaths with mild flattening at the posterior aspects of the globes, compatible with elevated intracranial pressures. Globes and orbital soft tissues otherwise unremarkable. Mild scattered mucosal thickening noted within the ethmoidal air cells. Paranasal sinuses are otherwise largely clear. No mastoid effusion. Inner ear structures grossly normal. Other: None. IMPRESSION: 1. 5.8 x 4.8 x 4.2 cm cystic mass with enhancing mural nodule positioned within the mid cerebellum. 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 Possible hemangioblastoma would be the primary differential consideration. 2. 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 Neuro surgical consultation recommended. Electronically Signed   By: Jeannine Boga M.D.   On: 11/29/2019 03:38    Pathology: SURGICAL PATHOLOGY  CASE: [id number]   PATIENT: Rolla Plate  Surgical Pathology Report   Clinical History: Cerebellar mass (jmc)   FINAL MICROSCOPIC DIAGNOSIS:   A. CEREBELLAR MASS, LEFT, RESECTION:  - Low grade glial/glioneuronal tumor; see comment.   COMMENT:  For complete report, please see Epic. The case was discussed with Dr.  Mickeal Skinner on 12/10/2019. Additional studies can be performed upon clinician  request.  Case discussed with Dr. Christella Noa on 12/04/2019.    INTRAOPERATIVE DIAGNOSIS:  A. Left cerebral mass: "Lesional tissue present."  Intraoperative diagnosis rendered by Dr. Lyndon Code at 5:09 PM on 11/30/2019.   GROSS DESCRIPTION:   Received fresh for rapid intraoperative consult is a 1.7 x 1.3 x 0.3 cm  portion of soft tan-red tissue. The specimen is submitted in toto for  frozen section. 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  He is clinically improved following surgery, with regards to headaches, gait impairment.  His visual acuity remains poor although it is also modestly improved from prior.  Fortunately he was able to be seen and evaluated yesterday by neuro-ophthalmologist Dr. Hassell Done at [facility].   We counseled him and his mother on pathology results and implications moving forward regarding his care and treatment.    At this time we recommend serial MRI monitoring only, no radiation or systemic therapy.  Screening for potential clinical trials was performed and discussed using eligibility criteria for active protocols at [facility], loco-regional tertiary centers, as well as national database available on [email or web address].com.  The patient is not a candidate for a research protocol at this time due to no suitable study identified.   We spent twenty additional minutes teaching regarding the natural history, biology, and historical experience in the treatment of brain tumors. We then discussed in  detail the current recommendations for therapy focusing on the mode of administration, mechanism of action, anticipated toxicities, and quality of life issues associated with this plan. We also provided teaching sheets for the patient to take home as an additional resource.  We ask that Verlan Grotz return to clinic in 3 months following next brain MRI, or sooner as needed.  All questions were answered. The patient knows to call the clinic with any problems, questions or concerns. No barriers to learning were detected.  The total time spent in the encounter was 60 minutes and more than 50% was on counseling and review of test results   Ventura Sellers, MD Medical Director of Neuro-Oncology [facility] [place] 12/28/19 9:25 AM

## 2020-01-01 ENCOUNTER — Telehealth: Payer: Self-pay | Admitting: Internal Medicine

## 2020-01-01 NOTE — Telephone Encounter (Signed)
Scheduled appt per 5/28 los. ° °Left a vm of the appt date and time. °

## 2020-02-15 ENCOUNTER — Telehealth: Payer: Self-pay | Admitting: Internal Medicine

## 2020-02-15 NOTE — Telephone Encounter (Signed)
Reschedule appt to 9/2. Provider on PAL. Pt is aware of appt time and date.

## 2020-03-03 ENCOUNTER — Other Ambulatory Visit: Payer: Self-pay | Admitting: Radiation Therapy

## 2020-03-28 ENCOUNTER — Other Ambulatory Visit: Payer: Self-pay

## 2020-03-28 ENCOUNTER — Ambulatory Visit
Admission: RE | Admit: 2020-03-28 | Discharge: 2020-03-28 | Disposition: A | Discharge status: Home / Self Care | Payer: No Typology Code available for payment source | Source: Ambulatory Visit | Attending: Internal Medicine | Admitting: Internal Medicine

## 2020-03-28 DIAGNOSIS — D497 Neoplasm of unspecified behavior of endocrine glands and other parts of nervous system: Secondary | ICD-10-CM

## 2020-03-28 MED ORDER — GADOBENATE DIMEGLUMINE 529 MG/ML IV SOLN
20.0000 mL | Freq: Once | INTRAVENOUS | Status: AC | PRN
  Administered 2020-03-28: 20 mL via INTRAVENOUS

## 2020-03-31 ENCOUNTER — Inpatient Hospital Stay: Payer: Medicaid Other | Attending: Internal Medicine

## 2020-04-01 ENCOUNTER — Ambulatory Visit: Payer: BC Managed Care – PPO | Admitting: Internal Medicine

## 2020-04-03 ENCOUNTER — Other Ambulatory Visit: Payer: Self-pay

## 2020-04-03 ENCOUNTER — Inpatient Hospital Stay: Payer: Self-pay | Attending: Internal Medicine | Admitting: Internal Medicine

## 2020-04-03 VITALS — BP 130/86 | HR 78 | Temp 97.1°F | Resp 18 | Ht 68.0 in | Wt 237.6 lb

## 2020-04-03 DIAGNOSIS — F17210 Nicotine dependence, cigarettes, uncomplicated: Secondary | ICD-10-CM | POA: Insufficient documentation

## 2020-04-03 DIAGNOSIS — D432 Neoplasm of uncertain behavior of brain, unspecified: Secondary | ICD-10-CM | POA: Insufficient documentation

## 2020-04-03 DIAGNOSIS — D497 Neoplasm of unspecified behavior of endocrine glands and other parts of nervous system: Secondary | ICD-10-CM

## 2020-04-03 NOTE — Progress Notes (Signed)
[facility] [place], [place] [address] [phone]   New Patient Evaluation  Date of Service: 04/03/20 Patient Name: Tommy Russell Patient MRN: [id number] Patient DOB: 20-Oct-1990 Provider: Ventura Sellers, MD  Identifying Statement:  Tommy Russell is a 29 y.o. 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    CNS Oncologic History 11/29/19: Sub-occipital craniotomy, resection by Dr. Christella Noa and Dr. Marcello Moores  Biomarkers:  MGMT Unknown.  IDH 1/2 Unknown.  EGFR Unknown  TERT Unknown   Interval History:  Tommy Russell presents today for follow up after recent MRI brain.  He describes continued impaired vision affecting both eyes, no improvement.  He is followed by neuro-ophthalmology at [facility].  No seizures since prior visit.  He does experience dizziness and vertigo when he tilts his head backwards.  Headaches have been occurring roughly 3-4x per week, he typically doses ibuprofen with good efficacy.  H+P (12/28/19) Patient presented to medical attention in April 2021 after several weeks of progressive blurry vision.  Several days before presentation, he began to experience severe morning headaches, different from typical tension headaches.  In addition, his walking and balance became somewhat impaired, although he never required any assist or had any falls.  CNS imaging demonstrated large cystic mass in the posterior fossa with accompanying hydrocephalus.  Mass was resected on 11/29/19 and accompanied by EVD placement.  Drain was removed after several days, and patient improved clinically except for visual impairment.  At this time he feels back to normal physically, vision is still very blurry.    Medications: Current Outpatient Medications on File Prior to Visit  Medication Sig Dispense Refill   acetaminophen (TYLENOL) 500 MG tablet Take 1,000 mg by mouth every 6  (six) hours as needed.     ibuprofen (ADVIL) 600 MG tablet Take 600 mg by mouth every 8 (eight) hours as needed.     No current facility-administered medications on file prior to visit.    Allergies: No Known Allergies Past Medical History: No past medical history on file. Past Surgical History:  Past Surgical History:  Procedure Laterality Date   SUBOCCIPITAL CRANIECTOMY CERVICAL LAMINECTOMY N/A 11/29/2019   Procedure: SUBOCCIPITAL CRANIECTOMY CERVICAL LAMINECTOMY/DURAPLASTY FOR RESECTION OF MASSS AND PLACEMENT OF EXTERNAL VENTRICULAR DRAIN ;  Surgeon: Vallarie Mare, MD;  Location: [facility];  Service: Neurosurgery;  Laterality: N/A;   Social History:  Social History   Socioeconomic History   Marital status: Single    Spouse name: Not on file   Number of children: Not on file   Years of education: Not on file   Highest education level: Not on file  Occupational History   Not on file  Tobacco Use   Smoking status: Current Every Day Smoker   Smokeless tobacco: Never Used  Substance and Sexual Activity   Alcohol use: Yes   Drug use: Never   Sexual activity: Not on file  Other Topics Concern   Not on file  Social History Narrative   Not on file   Social Determinants of Health   Financial Resource Strain:    Difficulty of Paying Living Expenses: Not on file  Food Insecurity:    Worried About [place] in the Last Year: Not on file   Ran Out of Food in the Last Year: Not on file  Transportation Needs:    Lack of Transportation (Medical): Not on file  Lack of Transportation (Non-Medical): Not on file  Physical Activity:    Days of Exercise per Week: Not on file   Minutes of Exercise per Session: Not on file  Stress:    Feeling of Stress : Not on file  Social Connections:    Frequency of Communication with Friends and Family: Not on file   Frequency of Social Gatherings with Friends and Family: Not on file   Attends Religious  Services: Not on file   Active Member of Clubs or Organizations: Not on file   Attends Archivist Meetings: Not on file   Marital Status: Not on file  Intimate Partner Violence:    Fear of Current or Ex-Partner: Not on file   Emotionally Abused: Not on file   Physically Abused: Not on file   Sexually Abused: Not on file   Family History: No family history on file.  Review of Systems: Constitutional: Doesn't report fevers, chills or abnormal weight loss Eyes: Doesn't report blurriness of vision Ears, nose, mouth, throat, and face: Doesn't report sore throat Respiratory: Doesn't report cough, dyspnea or wheezes Cardiovascular: Doesn't report palpitation, chest discomfort  Gastrointestinal:  Doesn't report nausea, constipation, diarrhea GU: Doesn't report incontinence Skin: Doesn't report skin rashes Neurological: Per HPI Musculoskeletal: Doesn't report joint pain Behavioral/Psych: Doesn't report anxiety  Physical Exam: Vitals:   04/03/20 0926  BP: 130/86  Pulse: 78  Resp: 18  Temp: (!) 97.1 F (36.2 C)  SpO2: 100%   KPS: 80. General: Alert, cooperative, pleasant, in no acute distress Head: Normal EENT: No conjunctival injection or scleral icterus.  Lungs: Resp effort normal Cardiac: Regular rate Abdomen: Non-distended abdomen Skin: No rashes cyanosis or petechiae. Extremities: No clubbing or edema  Neurologic Exam: Mental Status: Awake, alert, attentive to examiner. Oriented to self and environment. Language is fluent with intact comprehension.  Cranial Nerves: Visual acuity is impaired in both eyes. Visual fields are full. Extra-ocular movements intact. No ptosis. Face is symmetric Motor: Tone and bulk are normal. Power is full in both arms and legs. Reflexes are symmetric, no pathologic reflexes present.  Sensory: Intact to light touch Gait: Independent, impaired tandem.   Labs: I have reviewed the data as listed    Component Value Date/Time    NA 140 12/01/2019 0226   K 4.2 12/01/2019 0226   CL 107 12/01/2019 0226   CO2 24 12/01/2019 0226   GLUCOSE 145 (H) 12/01/2019 0226   BUN 10 12/01/2019 0226   CREATININE 0.84 12/01/2019 0226   CALCIUM 9.2 12/01/2019 0226   PROT 7.6 11/28/2019 2251   ALBUMIN 4.4 11/28/2019 2251   AST 15 11/28/2019 2251   ALT 13 11/28/2019 2251   ALKPHOS 68 11/28/2019 2251   BILITOT 1.0 11/28/2019 2251   GFRNONAA >60 12/01/2019 0226   GFRAA >60 12/01/2019 0226   Lab Results  Component Value Date   WBC 6.5 11/29/2019   NEUTROABS 3.7 11/29/2019   HGB 14.5 11/29/2019   HCT 43.3 11/29/2019   MCV 95.0 11/29/2019   PLT 315 11/29/2019   Imaging:  [place] Clinician Interpretation: I have personally reviewed the CNS images as listed.  My interpretation, in the context of the patient's clinical presentation, is stable disease  MR BRAIN W WO CONTRAST  Result Date: 03/28/2020 CLINICAL DATA:  Surgery for tumor in April of this year.  Follow-up. EXAM: MRI HEAD WITHOUT AND WITH CONTRAST TECHNIQUE: Multiplanar, multiecho pulse sequences of the brain and surrounding structures were obtained without and with intravenous contrast. CONTRAST:  70m MULTIHANCE GADOBENATE DIMEGLUMINE 529 MG/ML IV SOLN COMPARISON:  None. FINDINGS: Brain: Previous occipital craniotomy for resection of a left posteromedial cerebellar mass. Persistent cystic collection in that location measures 2.7 x 1.7 x 2.3 cm. No enhancement of the wall. No residual or recurrent enhancing nodule. No brain edema or adjacent gliosis. Previously placed but subsequently removed ventriculostomy from a right occipital approach. Some gliosis and hemosiderin deposition along that track. Otherwise, the brain has normal appearance. No second lesion. No ischemic infarction. Resolution of previously seen hydrocephalus. No extra-axial fluid collection. No abnormal enhancement elsewhere. Vascular: Major vessels at the base of the brain show flow. Skull and upper cervical  spine: Negative Sinuses/Orbits: Clear except for mild mucosal thickening of the maxillary sinus floors. Orbits negative. Other: None IMPRESSION: 1. Previous occipital craniotomy for resection of a left posteromedial cerebellar mass. Persistent cystic collection in that location measuring 2.7 x 1.7 x 2.3 cm. No evidence of residual or recurrent enhancing tumor. 2. Right occipital ventriculostomy has been removed. Gliosis and hemosiderin deposition along that track. Resolution of previously seen hydrocephalus. Electronically Signed   By: MNelson ChimesM.D.   On: 03/28/2020 12:39    Assessment/Plan Glioneuronal tumor [D49.7]  We appreciate the opportunity to participate in the care of MMuhannad Bignell  He is clinically and radiographically stable today.  No changes to medications today, counseled on management of tension headaches.  Ok with PRN NSAIDs but limit to less than daily.  We ask that MDavyn Morandireturn to clinic in 6 months following next brain MRI, or sooner as needed.  All questions were answered. The patient knows to call the clinic with any problems, questions or concerns. No barriers to learning were detected.  I have spent a total of 30 minutes of face-to-face and non-face-to-face time, excluding clinical staff time, preparing to see patient, ordering tests and/or medications, counseling the patient, and independently interpreting results and communicating results to the patient/family/caregiver   ZVentura Sellers MD Medical Director of Neuro-Oncology [facility]09/02/21 9:24 AM

## 2020-04-28 ENCOUNTER — Encounter: Payer: Self-pay | Admitting: Internal Medicine

## 2020-06-21 ENCOUNTER — Encounter: Payer: Self-pay | Admitting: Internal Medicine

## 2020-08-29 ENCOUNTER — Other Ambulatory Visit: Payer: Self-pay | Admitting: Radiation Therapy

## 2020-08-29 ENCOUNTER — Telehealth: Payer: Self-pay | Admitting: Radiation Therapy

## 2020-08-29 NOTE — Telephone Encounter (Signed)
Spoke with the patient's mother and left a detailed message on the patient's voicemail about his upcoming brain MRI and follow-up with Dr. Mickeal Skinner.   Mont Dutton R.T.(R)(T) Radiation Special Procedures Navigator

## 2020-08-29 NOTE — Telephone Encounter (Signed)
Error

## 2020-09-26 ENCOUNTER — Other Ambulatory Visit: Payer: Self-pay

## 2020-09-26 ENCOUNTER — Ambulatory Visit (HOSPITAL_COMMUNITY)
Admission: RE | Admit: 2020-09-26 | Discharge: 2020-09-26 | Disposition: A | Discharge status: Home / Self Care | Payer: Self-pay | Source: Ambulatory Visit | Attending: Internal Medicine | Admitting: Internal Medicine

## 2020-09-26 ENCOUNTER — Encounter (HOSPITAL_COMMUNITY): Payer: Self-pay

## 2020-09-26 DIAGNOSIS — D497 Neoplasm of unspecified behavior of endocrine glands and other parts of nervous system: Secondary | ICD-10-CM

## 2020-09-28 ENCOUNTER — Ambulatory Visit (HOSPITAL_COMMUNITY)
Admission: RE | Admit: 2020-09-28 | Discharge: 2020-09-28 | Disposition: A | Discharge status: Home / Self Care | Payer: Self-pay | Source: Ambulatory Visit | Attending: Internal Medicine | Admitting: Internal Medicine

## 2020-09-28 DIAGNOSIS — D497 Neoplasm of unspecified behavior of endocrine glands and other parts of nervous system: Secondary | ICD-10-CM | POA: Insufficient documentation

## 2020-09-28 MED ORDER — GADOBUTROL 1 MMOL/ML IV SOLN
10.0000 mL | Freq: Once | INTRAVENOUS | Status: AC | PRN
  Administered 2020-09-28: 10 mL via INTRAVENOUS

## 2020-09-29 ENCOUNTER — Inpatient Hospital Stay: Payer: No Typology Code available for payment source | Attending: Internal Medicine

## 2020-10-02 ENCOUNTER — Inpatient Hospital Stay: Payer: Self-pay | Attending: Internal Medicine | Admitting: Internal Medicine

## 2020-10-02 ENCOUNTER — Other Ambulatory Visit: Payer: Self-pay

## 2020-10-02 VITALS — BP 129/87 | HR 93 | Temp 97.8°F | Resp 15 | Ht 68.0 in | Wt 269.6 lb

## 2020-10-02 DIAGNOSIS — D497 Neoplasm of unspecified behavior of endocrine glands and other parts of nervous system: Secondary | ICD-10-CM | POA: Insufficient documentation

## 2020-10-02 DIAGNOSIS — F17210 Nicotine dependence, cigarettes, uncomplicated: Secondary | ICD-10-CM | POA: Insufficient documentation

## 2020-10-02 NOTE — Progress Notes (Signed)
[facility] at [place], [place] [address] [phone]   Interval Evaluation  Date of Service: 10/02/20 Patient Name: Tommy Russell Patient MRN: [id number] Patient DOB: 28-Jan-1991 Provider: Ventura Sellers, MD  Identifying Statement:  Tommy Russell is a 30 y.o. 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 Christella Noa and Dr. Marcello Moores  Biomarkers:  MGMT Unknown.  IDH 1/2 Unknown.  EGFR Unknown  TERT Unknown   Interval History:  Tommy Russell presents today for follow up after recent MRI brain. Continues to experience dense visual impairment, now feels he has lost some color vision in past month or so.  Also copmlains of some tingling in his left arm/hand when it is bent in prolonged fashion.  He is followed by neuro-ophthalmology at [facility].  No seizures since prior visit.  Headaches are still frequent, less than daily; he typically doses ibuprofen with good efficacy.  H+P (12/28/19) Patient presented to medical attention in April 2021 after several weeks of progressive blurry vision.  Several days before presentation, he began to experience severe morning headaches, different from typical tension headaches.  In addition, his walking and balance became somewhat impaired, although he never required any assist or had any falls.  CNS imaging demonstrated large cystic mass in the posterior fossa with accompanying hydrocephalus.  Mass was resected on 11/29/19 and accompanied by EVD placement.  Drain was removed after several days, and patient improved clinically except for visual impairment.  At this time he feels back to normal physically, vision is still very blurry.    Medications: Current Outpatient Medications on File Prior to Visit  Medication Sig Dispense Refill  . acetaminophen (TYLENOL) 500 MG tablet Take 1,000 mg by mouth every 6 (six) hours as  needed.    Marland Kitchen ibuprofen (ADVIL) 600 MG tablet Take 600 mg by mouth every 8 (eight) hours as needed.     No current facility-administered medications on file prior to visit.    Allergies: No Known Allergies Past Medical History: No past medical history on file. Past Surgical History:  Past Surgical History:  Procedure Laterality Date  . SUBOCCIPITAL CRANIECTOMY CERVICAL LAMINECTOMY N/A 11/29/2019   Procedure: SUBOCCIPITAL CRANIECTOMY CERVICAL LAMINECTOMY/DURAPLASTY FOR RESECTION OF MASSS AND PLACEMENT OF EXTERNAL VENTRICULAR DRAIN ;  Surgeon: Vallarie Mare, MD;  Location: [facility];  Service: Neurosurgery;  Laterality: N/A;   Social History:  Social History   Socioeconomic History  . Marital status: Single    Spouse name: Not on file  . Number of children: Not on file  . Years of education: Not on file  . Highest education level: Not on file  Occupational History  . Not on file  Tobacco Use  . Smoking status: Current Every Day Smoker  . Smokeless tobacco: Never Used  Substance and Sexual Activity  . Alcohol use: Yes  . Drug use: Never  . Sexual activity: Not on file  Other Topics Concern  . Not on file  Social History Narrative  . Not on file   Social Determinants of Health   Financial Resource Strain: Not on file  Food Insecurity: Not on file  Transportation Needs: Not on file  Physical Activity: Not on file  Stress: Not on file  Social Connections: Not on file  Intimate Partner Violence: Not on file   Family History: No family history on file.  Review of Systems: Constitutional: Doesn't report  fevers, chills or abnormal weight loss Eyes: Doesn't report blurriness of vision Ears, nose, mouth, throat, and face: Doesn't report sore throat Respiratory: Doesn't report cough, dyspnea or wheezes Cardiovascular: Doesn't report palpitation, chest discomfort  Gastrointestinal:  Doesn't report nausea, constipation, diarrhea GU: Doesn't report incontinence Skin: Doesn't  report skin rashes Neurological: Per HPI Musculoskeletal: Doesn't report joint pain Behavioral/Psych: Doesn't report anxiety  Physical Exam: Vitals:   10/02/20 1145  BP: 129/87  Pulse: 93  Resp: 15  Temp: 97.8 F (36.6 C)  SpO2: 96%   KPS: 80. General: Alert, cooperative, pleasant, in no acute distress Head: Normal EENT: No conjunctival injection or scleral icterus.  Lungs: Resp effort normal Cardiac: Regular rate Abdomen: Non-distended abdomen Skin: No rashes cyanosis or petechiae. Extremities: No clubbing or edema  Neurologic Exam: Mental Status: Awake, alert, attentive to examiner. Oriented to self and environment. Language is fluent with intact comprehension.  Cranial Nerves: Visual acuity is impaired in both eyes. Visual fields are full. Extra-ocular movements intact. No ptosis. Face is symmetric Motor: Tone and bulk are normal. Power is full in both arms and legs. Reflexes are symmetric, no pathologic reflexes present.  Sensory: Intact to light touch Gait: Independent, impaired tandem.   Labs: I have reviewed the data as listed    Component Value Date/Time   NA 140 12/01/2019 0226   K 4.2 12/01/2019 0226   CL 107 12/01/2019 0226   CO2 24 12/01/2019 0226   GLUCOSE 145 (H) 12/01/2019 0226   BUN 10 12/01/2019 0226   CREATININE 0.84 12/01/2019 0226   CALCIUM 9.2 12/01/2019 0226   PROT 7.6 11/28/2019 2251   ALBUMIN 4.4 11/28/2019 2251   AST 15 11/28/2019 2251   ALT 13 11/28/2019 2251   ALKPHOS 68 11/28/2019 2251   BILITOT 1.0 11/28/2019 2251   GFRNONAA >60 12/01/2019 0226   GFRAA >60 12/01/2019 0226   Lab Results  Component Value Date   WBC 6.5 11/29/2019   NEUTROABS 3.7 11/29/2019   HGB 14.5 11/29/2019   HCT 43.3 11/29/2019   MCV 95.0 11/29/2019   PLT 315 11/29/2019   Imaging:  [place] Clinician Interpretation: I have personally reviewed the CNS images as listed.  My interpretation, in the context of the patient's clinical presentation, is stable  disease  MR BRAIN W WO CONTRAST  Result Date: 09/29/2020 CLINICAL DATA:  Brain/CNS neoplasm.  Assess treatment response. EXAM: MRI HEAD WITHOUT AND WITH CONTRAST TECHNIQUE: Multiplanar, multiecho pulse sequences of the brain and surrounding structures were obtained without and with intravenous contrast. CONTRAST:  23m GADAVIST GADOBUTROL 1 MMOL/ML IV SOLN COMPARISON:  MRI March 28, 2020. FINDINGS: Brain: Previous occipital craniotomy for resection a left posteromedial cerebellar mass. Persistent cystic collection is similar in size. No new or nodular/masslike enhancement at the site of resection. No new areas of edema. No progressive mass effect. Similar susceptibility artifact at the site of resection, compatible with postsurgical/prior hemorrhage. No acute infarct. No acute hemorrhage. No hydrocephalus. No extra-axial fluid collection. Sequela of prior occipital ventriculostomy with similar appearance of hemosiderin and mild gliosis around the tract. Vascular: Major arterial flow voids are maintained at the skull base. Skull and upper cervical spine: Occipital craniotomy. Posterior right occipital burr hole. Postsurgical changes in the subcutaneous soft tissues overlying the suboccipital craniectomy. Sinuses/Orbits: Inferior maxillary sinus mucosal thickening with left maxillary retention cyst. Unremarkable orbits. Other: No mastoid effusions. IMPRESSION: 1. Similar cerebellar postsurgical changes and cystic collection without new or nodular/masslike enhancement to suggest residual/recurrent tumor. 2. No acute abnormality.  Electronically Signed   By: Margaretha Sheffield MD   On: 09/29/2020 08:08    Assessment/Plan Glioneuronal tumor [D49.7]  We appreciate the opportunity to participate in the care of Tommy Russell.  He is clinically and radiographically stable today.  We counseled him on lifestyle modifications to address headache frequency.  He will defer headache prevention medication at this time.  Ok with PRN NSAIDs but limit to less than daily.  We ask that Tommy Russell return to clinic in 12 months following next brain MRI, or sooner as needed.  All questions were answered. The patient knows to call the clinic with any problems, questions or concerns. No barriers to learning were detected.  I have spent a total of 30 minutes of face-to-face and non-face-to-face time, excluding clinical staff time, preparing to see patient, ordering tests and/or medications, counseling the patient, and independently interpreting results and communicating results to the patient/family/caregiver   Ventura Sellers, MD Medical Director of Neuro-Oncology [facility] at [place] 10/02/20 11:40 AM

## 2020-10-07 ENCOUNTER — Encounter: Payer: Self-pay | Admitting: Internal Medicine

## 2021-04-11 IMAGING — CT CT HEAD W/O CM
4 series · 16 of 47 positions shown, 18 images · non-contrast
Comparison: Prior head CT examinations 12/03/2019 and earlier,
brain MRI 11/29/2019.

CLINICAL DATA: Evaluate ventricles; brain mass or lesion.

EXAM:
CT HEAD WITHOUT CONTRAST
TECHNIQUE: Contiguous axial images were obtained from the base of the skull
through the vertex without intravenous contrast.

[Series 3: head wo · axial · 0.44mm/px · z∈[-141,-16]mm · 7 of 35 slices shown, 9 images]
[im 5/35  brain]
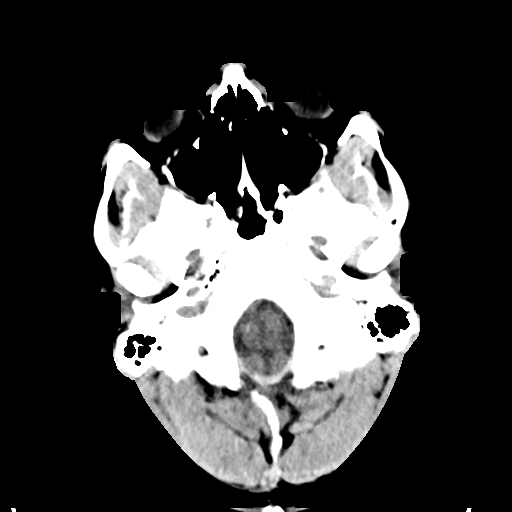
[im 5/35  bone]
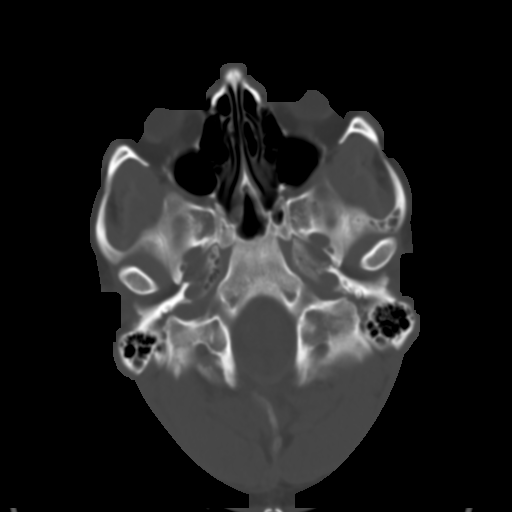
[im 9/35  brain]
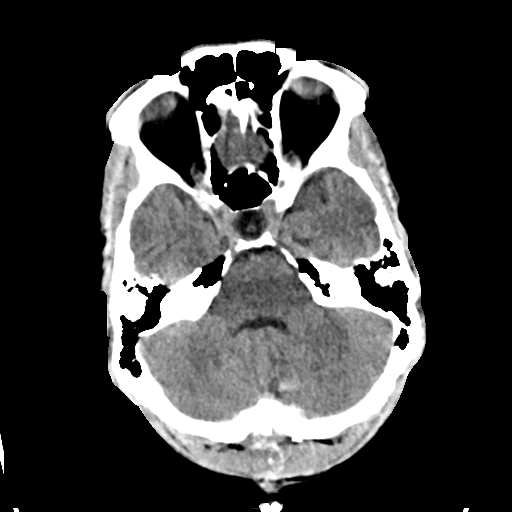
[im 13/35  brain]
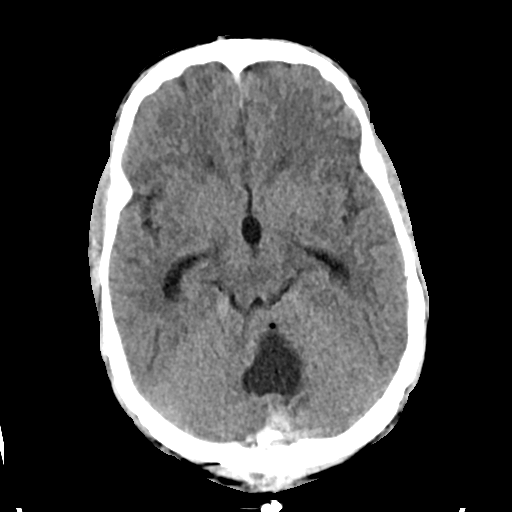
[im 18/35  brain]
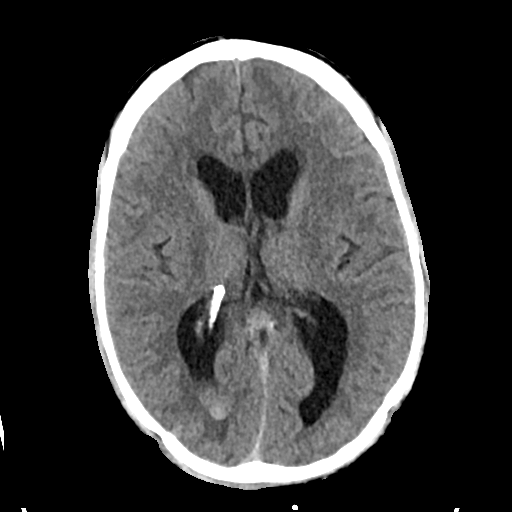
[im 22/35  brain]
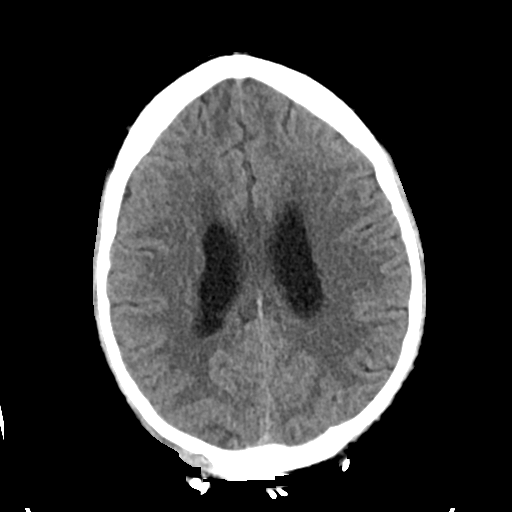
[im 22/35  bone]
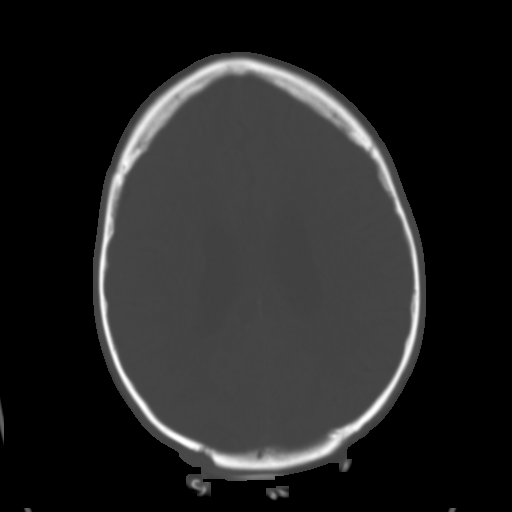
[im 26/35  brain]
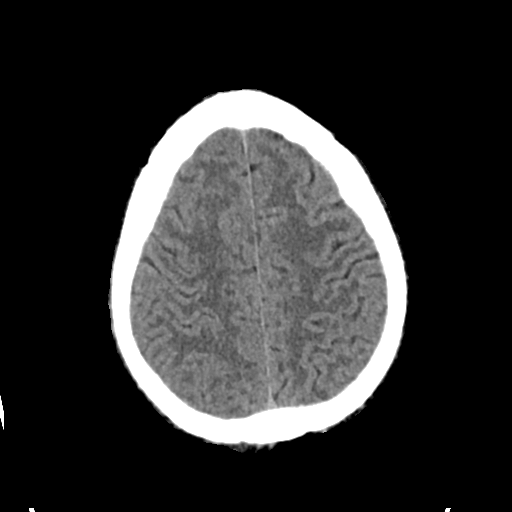
[im 30/35  brain]
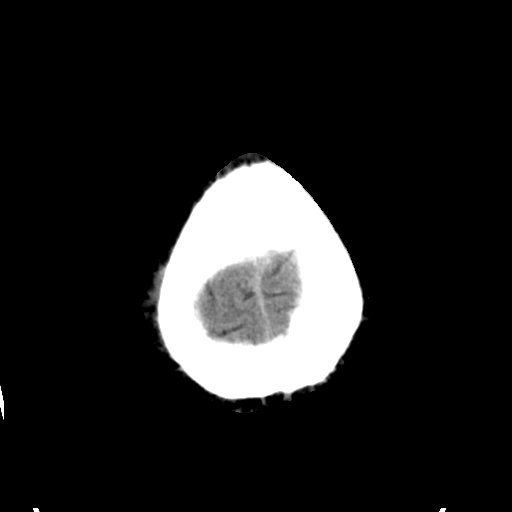

[Series 4: head bone · axial · 0.44mm/px · z∈[-145,-109]mm · 3 of 88 slices shown]
[im 9/88  bone]
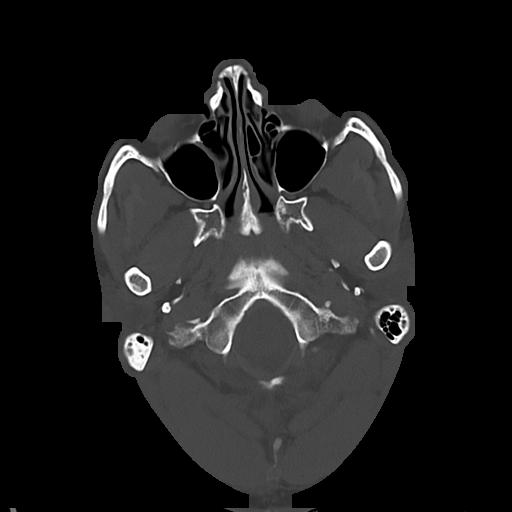
[im 18/88  bone]
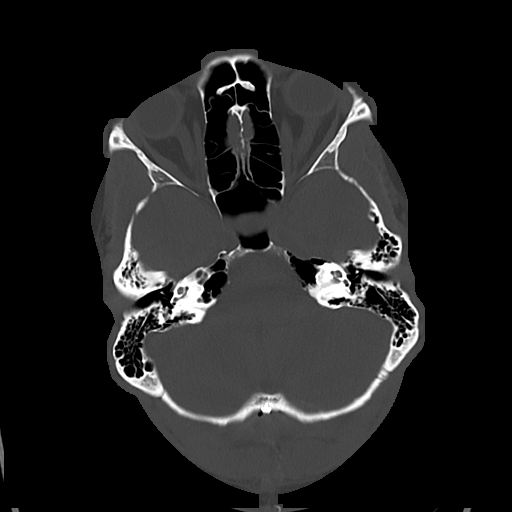
[im 27/88  bone]
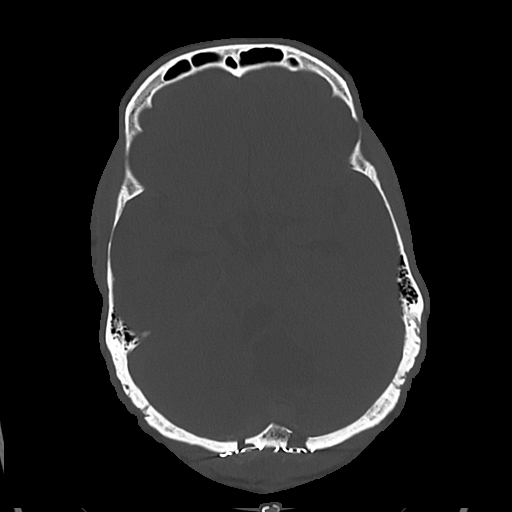

[Series 5: cor soft · coronal · 0.33mm/px · 3 of 72 slices shown]
[im 24/72  brain]
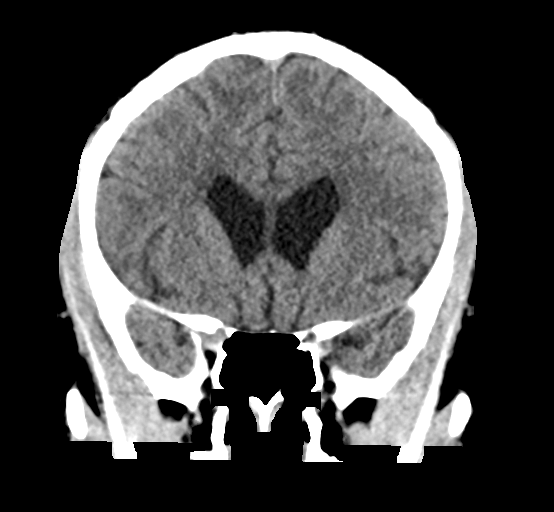
[im 32/72  brain]
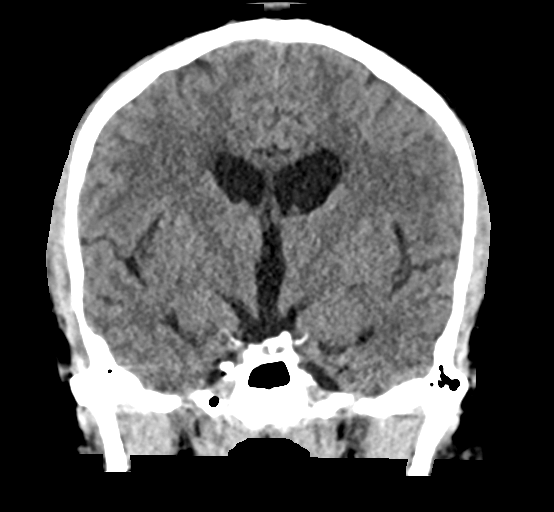
[im 40/72  brain]
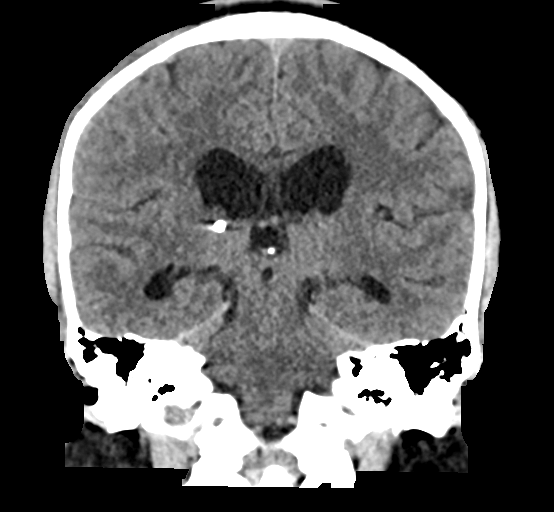

[Series 6: sag soft · sagittal · 0.35mm/px · 3 of 62 slices shown]
[im 21/62  brain]
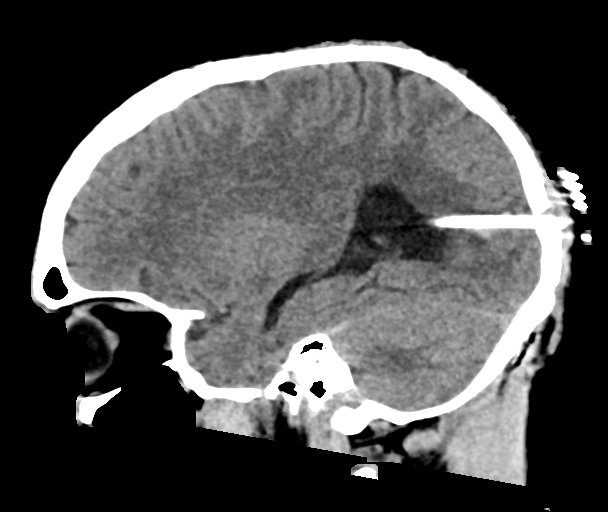
[im 31/62  brain]
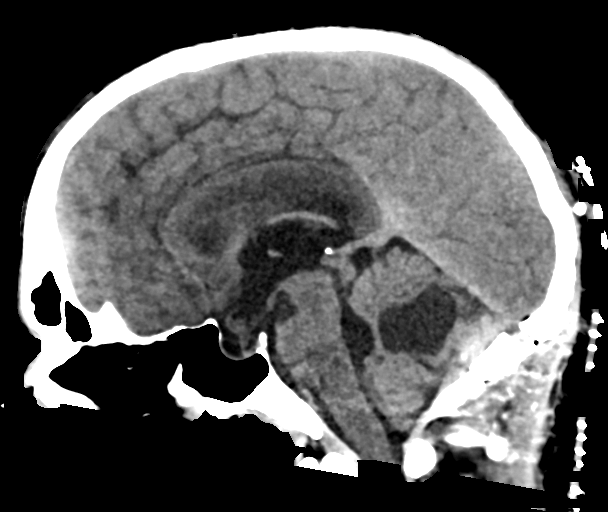
[im 41/62  brain]
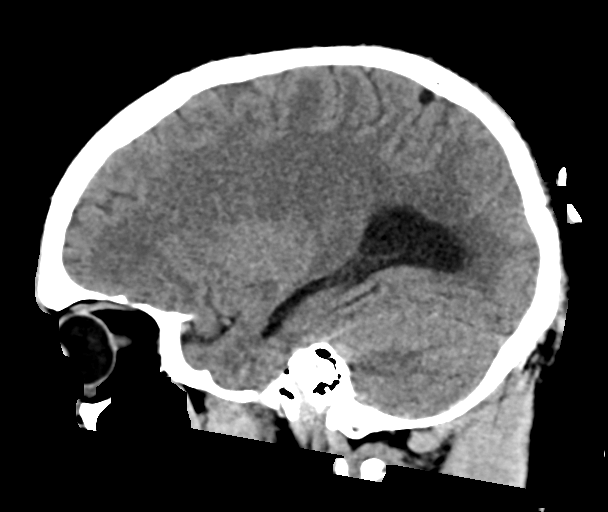

[16 of 47 positions shown; findings below may reference images not displayed]

FINDINGS: Brain:

Evolving sequela of prior suboccipital craniotomy and resection of a
posterior fossa mass. Redemonstrated resection cavity within the
paramedian left cerebellum. An adjacent cystic space has decreased
in size, now measuring 2.9 x 2.6 x 2.2 cm. This may reflect a
portion of the resection cavity or residuum of a previously
demonstrated peritumoral cyst. Persistent small focus of
pneumocephalus and unchanged small volume hemorrhage at the
resection site. There is persistent, although decreased effacement
of the fourth ventricle.

Unchanged position of a right occipital approach ventricular
catheter, again coursing through the occipital horn and atrium of
the right lateral ventricle and terminating in the region of the
right thalamus. Unchanged small amount of hemorrhage in the right
occipital lobe along the catheter tract and within the occipital
horn of the right lateral ventricle. Mild lateral and third
ventriculomegaly has slightly increased as compared to prior
examination 10/03/2019. For instance, the third ventricle now
measures 6 mm in with (previously 3 mm).

No demarcated cortical infarct.

Vascular: No hyperdense vessel.

Skull: Suboccipital craniotomy with mild scalp soft tissue swelling.
Overlying skin staples

Sinuses/Orbits: Visualized orbits show no acute finding. Trace
scattered paranasal sinus mucosal thickening. No significant mastoid
effusion.
IMPRESSION: 1. Mild lateral and third ventriculomegaly has slightly increased as
compared to prior head CT 12/03/2019. Unchanged position of a right
occipital approach ventricular catheter.
2. Evolving postoperative changes from recent posterior fossa mass
resection as described. Decreased posterior fossa mass effect with
persistent although decreased effacement of the fourth ventricle.
3. Unchanged small volume hemorrhage along the ventricular catheter
tract and within the occipital horn of the right lateral ventricle.

## 2021-05-09 ENCOUNTER — Encounter: Payer: Self-pay | Admitting: Internal Medicine

## 2021-09-24 ENCOUNTER — Ambulatory Visit (HOSPITAL_COMMUNITY): Admission: RE | Admit: 2021-09-24 | Payer: Self-pay | Source: Ambulatory Visit

## 2021-09-29 ENCOUNTER — Other Ambulatory Visit: Payer: Self-pay | Admitting: Radiation Therapy

## 2021-09-30 ENCOUNTER — Ambulatory Visit (HOSPITAL_COMMUNITY)
Admission: RE | Admit: 2021-09-30 | Discharge: 2021-09-30 | Disposition: A | Discharge status: Home / Self Care | Payer: Self-pay | Source: Ambulatory Visit | Attending: Internal Medicine | Admitting: Internal Medicine

## 2021-09-30 DIAGNOSIS — D497 Neoplasm of unspecified behavior of endocrine glands and other parts of nervous system: Secondary | ICD-10-CM | POA: Insufficient documentation

## 2021-09-30 MED ORDER — GADOBUTROL 1 MMOL/ML IV SOLN
10.0000 mL | Freq: Once | INTRAVENOUS | Status: AC | PRN
  Administered 2021-09-30: 10 mL via INTRAVENOUS

## 2021-10-01 ENCOUNTER — Encounter: Payer: Self-pay | Admitting: Internal Medicine

## 2021-10-01 ENCOUNTER — Inpatient Hospital Stay: Payer: Medicaid Other | Admitting: Internal Medicine

## 2021-10-05 ENCOUNTER — Inpatient Hospital Stay: Payer: No Typology Code available for payment source | Attending: Internal Medicine

## 2021-10-05 DIAGNOSIS — G43909 Migraine, unspecified, not intractable, without status migrainosus: Secondary | ICD-10-CM | POA: Insufficient documentation

## 2021-10-05 DIAGNOSIS — D497 Neoplasm of unspecified behavior of endocrine glands and other parts of nervous system: Secondary | ICD-10-CM | POA: Insufficient documentation

## 2021-10-12 ENCOUNTER — Other Ambulatory Visit: Payer: Self-pay

## 2021-10-12 ENCOUNTER — Inpatient Hospital Stay (HOSPITAL_BASED_OUTPATIENT_CLINIC_OR_DEPARTMENT_OTHER): Payer: No Typology Code available for payment source | Admitting: Internal Medicine

## 2021-10-12 VITALS — BP 136/87 | HR 86 | Temp 97.7°F | Resp 16 | Ht 68.0 in | Wt 266.7 lb

## 2021-10-12 DIAGNOSIS — D497 Neoplasm of unspecified behavior of endocrine glands and other parts of nervous system: Secondary | ICD-10-CM

## 2021-10-12 MED ORDER — PREDNISONE 50 MG PO TABS: 50.0000 mg | ORAL_TABLET | Freq: Every day | ORAL | 0 refills | Status: DC

## 2021-10-12 MED ORDER — AMITRIPTYLINE HCL 50 MG PO TABS: 50.0000 mg | ORAL_TABLET | Freq: Every day | ORAL | 2 refills | Status: DC

## 2021-10-12 NOTE — Progress Notes (Signed)
[facility] [place], [place] [address] [phone]   Interval Evaluation  Date of Service: 10/12/21 Patient Name: Tommy Russell Patient MRN: [id number] Patient DOB: 17-May-1991 Provider: Ventura Sellers, MD  Identifying Statement:  Tommy Russell is a 31 y.o. 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 Christella Noa and Dr. Marcello Moores  Biomarkers:  MGMT Unknown.  IDH 1/2 Unknown.  EGFR Unknown  TERT Unknown   Interval History:  Tommy Russell presents today for follow up after recent MRI brain. Continues to experience dense visual impairment, most recent update is retinal tear in his left eye, scheduled to be repaired by his ophthalmologist next week.  No seizures over the past year.  Headaches are more of a problem, occurring daily and lasting hours with notable photophobia.   H+P (12/28/19) Patient presented to medical attention in April 2021 after several weeks of progressive blurry vision.  Several days before presentation, he began to experience severe morning headaches, different from typical tension headaches.  In addition, his walking and balance became somewhat impaired, although he never required any assist or had any falls.  CNS imaging demonstrated large cystic mass in the posterior fossa with accompanying hydrocephalus.  Mass was resected on 11/29/19 and accompanied by EVD placement.  Drain was removed after several days, and patient improved clinically except for visual impairment.  At this time he feels back to normal physically, vision is still very blurry.    Medications: Current Outpatient Medications on File Prior to Visit  Medication Sig Dispense Refill   ibuprofen (ADVIL) 600 MG tablet Take 600 mg by mouth every 8 (eight) hours as needed.     No current facility-administered medications on file prior to visit.    Allergies: No  Known Allergies Past Medical History: No past medical history on file. Past Surgical History:  Past Surgical History:  Procedure Laterality Date   SUBOCCIPITAL CRANIECTOMY CERVICAL LAMINECTOMY N/A 11/29/2019   Procedure: SUBOCCIPITAL CRANIECTOMY CERVICAL LAMINECTOMY/DURAPLASTY FOR RESECTION OF MASSS AND PLACEMENT OF EXTERNAL VENTRICULAR DRAIN ;  Surgeon: Vallarie Mare, MD;  Location: [facility];  Service: Neurosurgery;  Laterality: N/A;   Social History:  Social History   Socioeconomic History   Marital status: Married    Spouse name: Not on file   Number of children: Not on file   Years of education: Not on file   Highest education level: Not on file  Occupational History   Not on file  Tobacco Use   Smoking status: Every Day   Smokeless tobacco: Never  Substance and Sexual Activity   Alcohol use: Yes   Drug use: Never   Sexual activity: Not on file  Other Topics Concern   Not on file  Social History Narrative   Not on file   Social Determinants of Health   Financial Resource Strain: Not on file  Food Insecurity: Not on file  Transportation Needs: Not on file  Physical Activity: Not on file  Stress: Not on file  Social Connections: Not on file  Intimate Partner Violence: Not on file   Family History: No family history on file.  Review of Systems: Constitutional: Doesn't report fevers, chills or abnormal weight loss Eyes: Doesn't report blurriness of vision Ears, nose, mouth, throat, and face: Doesn't report sore throat Respiratory: Doesn't report cough, dyspnea or wheezes Cardiovascular: Doesn't report palpitation, chest discomfort  Gastrointestinal:  Doesn't report nausea, constipation, diarrhea GU: Doesn't report incontinence Skin: Doesn't report skin rashes Neurological: Per HPI Musculoskeletal: Doesn't report joint pain Behavioral/Psych: Doesn't report anxiety  Physical Exam: Vitals:   10/12/21 1428  BP: 136/87  Pulse: 86  Resp: 16  Temp: 97.7 F  (36.5 C)  SpO2: 100%   KPS: 80. General: Alert, cooperative, pleasant, in no acute distress Head: Normal EENT: No conjunctival injection or scleral icterus.  Lungs: Resp effort normal Cardiac: Regular rate Abdomen: Non-distended abdomen Skin: No rashes cyanosis or petechiae. Extremities: No clubbing or edema  Neurologic Exam: Mental Status: Awake, alert, attentive to examiner. Oriented to self and environment. Language is fluent with intact comprehension.  Cranial Nerves: Visual acuity is impaired in both eyes. Visual fields are full. Extra-ocular movements intact. No ptosis. Face is symmetric Motor: Tone and bulk are normal. Power is full in both arms and legs. Reflexes are symmetric, no pathologic reflexes present.  Sensory: Intact to light touch Gait: Independent, impaired tandem.   Labs: I have reviewed the data as listed    Component Value Date/Time   NA 140 12/01/2019 0226   K 4.2 12/01/2019 0226   CL 107 12/01/2019 0226   CO2 24 12/01/2019 0226   GLUCOSE 145 (H) 12/01/2019 0226   BUN 10 12/01/2019 0226   CREATININE 0.84 12/01/2019 0226   CALCIUM 9.2 12/01/2019 0226   PROT 7.6 11/28/2019 2251   ALBUMIN 4.4 11/28/2019 2251   AST 15 11/28/2019 2251   ALT 13 11/28/2019 2251   ALKPHOS 68 11/28/2019 2251   BILITOT 1.0 11/28/2019 2251   GFRNONAA >60 12/01/2019 0226   GFRAA >60 12/01/2019 0226   Lab Results  Component Value Date   WBC 6.5 11/29/2019   NEUTROABS 3.7 11/29/2019   HGB 14.5 11/29/2019   HCT 43.3 11/29/2019   MCV 95.0 11/29/2019   PLT 315 11/29/2019   Imaging:  [facility] Clinician Interpretation: I have personally reviewed the CNS images as listed.  My interpretation, in the context of the patient's clinical presentation, is stable disease  MR BRAIN W WO CONTRAST  Result Date: 10/01/2021 CLINICAL DATA:  Glial neural tumor.  Assess treatment response EXAM: MRI HEAD WITHOUT AND WITH CONTRAST TECHNIQUE: Multiplanar, multiecho pulse sequences of the brain  and surrounding structures were obtained without and with intravenous contrast. CONTRAST:  76m GADAVIST GADOBUTROL 1 MMOL/ML IV SOLN COMPARISON:  MRI head 09/28/2020 FINDINGS: Brain: Postop resection of cystic mass in the posterior fossa in the midline. Cyst in the cerebellar vermis is unchanged in size measuring 25 x 16 mm. No significant surrounding edema. No nodular enhancement. Chronic blood products are present lining the cyst unchanged. Ventricle size normal. Negative for acute infarct. Cerebral white matter normal. 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 Vascular: Normal arterial flow voids at the skull base Skull and upper cervical spine: No acute abnormality. Suboccipital craniectomy. Sinuses/Orbits: Moderate mucosal edema throughout the paranasal sinuses. Air-fluid level left maxillary sinus. Mastoid clear bilaterally Other: None IMPRESSION: Stable MRI. Postsurgical cyst in the cerebellar vermis is stable without abnormal enhancement or edema. No hydrocephalus. Electronically Signed   By: CFranchot GalloM.D.   On: 10/01/2021 11:37     Assessment/Plan Glioneuronal tumor [D49.7]  We appreciate the opportunity to participate in the care of MCarroll Russell  He is clinically and radiographically stable today from oncologic standpoint.  For headaches with migrainous features, recommended trial of Elavil 572mHS as prophylaxis.  Will also recommend short course of prednisone  45m daily x7 days to help wean off chronic NSAIDs.  We ask that MBraelyn Bordonaroreturn to clinic in 12 months following next brain MRI, or sooner as needed.  We will also touch base with him via phone in 3-4 weeks to continue headache med titration.  All questions were answered. The patient knows to call the clinic with any problems, questions or concerns. No barriers to learning were detected.  I have spent a total of 30 minutes of face-to-face and non-face-to-face time, excluding clinical  staff time, preparing to see patient, ordering tests and/or medications, counseling the patient, and independently interpreting results and communicating results to the patient/family/caregiver   ZVentura Sellers MD Medical Director of Neuro-Oncology [facility]03/13/23 2:49 PM

## 2021-10-13 ENCOUNTER — Encounter: Payer: Self-pay | Admitting: Internal Medicine

## 2021-10-13 ENCOUNTER — Telehealth: Payer: Self-pay | Admitting: Internal Medicine

## 2021-10-13 NOTE — Telephone Encounter (Signed)
Scheduled per 3/13 los, pt has been called and confirmed  ?

## 2021-11-02 ENCOUNTER — Encounter: Payer: Self-pay | Admitting: Internal Medicine

## 2021-11-02 ENCOUNTER — Inpatient Hospital Stay: Payer: Self-pay | Attending: Internal Medicine | Admitting: Internal Medicine

## 2021-11-02 DIAGNOSIS — D497 Neoplasm of unspecified behavior of endocrine glands and other parts of nervous system: Secondary | ICD-10-CM

## 2021-11-02 NOTE — Progress Notes (Signed)
I connected with Tommy Russell on 11/02/21 at 10:30 AM EDT by telephone visit and verified that I am speaking with the correct person using two identifiers.  ?I discussed the limitations, risks, security and privacy concerns of performing an evaluation and management service by telemedicine and the availability of in-person appointments. I also discussed with the patient that there may be a patient responsible charge related to this service. The patient expressed understanding and agreed to proceed.  ?Other persons participating in the visit and their role in the encounter:  n/a  ?Patient's location:  Home  ?Provider's location:  Office  ?Chief Complaint:  Glioneuronal tumor ? ?History of Present Ilness: Tommy Russell describes significant improvement in headache burden after dosing steroids for 1 weeks and starting nightly elavil.  His sleep is much improved.  He only needed to use advil once in the past three weeks.  No other new or progressive symptoms. ?Observations: Language and cognition at baseline ?Assessment and Plan: Glioneuronal tumor ? ?Clinically stable with improvement in headaches following steroids, prophylaxis with Elavil.   ? ?No further prednisone, will con't Elavil '50mg'$  HS due to good efficacy. ? ?Follow Up Instructions: RTC as scheduled or sooner with progressive or recurrent symptoms ? ?I discussed the assessment and treatment plan with the patient.  The patient was provided an opportunity to ask questions and all were answered.  The patient agreed with the plan and demonstrated understanding of the instructions.   ? ?The patient was advised to call back or seek an in-person evaluation if the symptoms worsen or if the condition fails to improve as anticipated.  I provided 5-10 minutes of non-face-to-face time during this enocunter. ? ?Tommy Sellers, MD ? ? ?I provided 15 minutes of non face-to-face telephone visit time during this encounter, and > 50% was spent counseling as documented under  my assessment & plan.  ? ?

## 2021-11-11 ENCOUNTER — Encounter: Payer: Self-pay | Admitting: Internal Medicine

## 2021-12-03 ENCOUNTER — Other Ambulatory Visit: Payer: Self-pay | Admitting: Internal Medicine

## 2022-04-21 ENCOUNTER — Encounter: Payer: Self-pay | Admitting: Internal Medicine

## 2022-04-24 ENCOUNTER — Inpatient Hospital Stay (HOSPITAL_BASED_OUTPATIENT_CLINIC_OR_DEPARTMENT_OTHER)
Admission: EM | Admit: 2022-04-24 | Discharge: 2022-04-27 | DRG: 193 | Disposition: A | Discharge status: Home / Self Care | Payer: Self-pay | Attending: Internal Medicine | Admitting: Internal Medicine

## 2022-04-24 ENCOUNTER — Encounter (HOSPITAL_COMMUNITY): Payer: Self-pay

## 2022-04-24 ENCOUNTER — Encounter (HOSPITAL_BASED_OUTPATIENT_CLINIC_OR_DEPARTMENT_OTHER): Payer: Self-pay | Admitting: Pediatrics

## 2022-04-24 ENCOUNTER — Other Ambulatory Visit: Payer: Self-pay

## 2022-04-24 ENCOUNTER — Emergency Department (HOSPITAL_BASED_OUTPATIENT_CLINIC_OR_DEPARTMENT_OTHER): Payer: Self-pay

## 2022-04-24 DIAGNOSIS — J9601 Acute respiratory failure with hypoxia: Secondary | ICD-10-CM | POA: Diagnosis present

## 2022-04-24 DIAGNOSIS — Z7952 Long term (current) use of systemic steroids: Secondary | ICD-10-CM

## 2022-04-24 DIAGNOSIS — E669 Obesity, unspecified: Secondary | ICD-10-CM | POA: Diagnosis present

## 2022-04-24 DIAGNOSIS — Z79899 Other long term (current) drug therapy: Secondary | ICD-10-CM

## 2022-04-24 DIAGNOSIS — F17200 Nicotine dependence, unspecified, uncomplicated: Secondary | ICD-10-CM | POA: Diagnosis present

## 2022-04-24 DIAGNOSIS — J189 Pneumonia, unspecified organism: Principal | ICD-10-CM | POA: Diagnosis present

## 2022-04-24 DIAGNOSIS — J4521 Mild intermittent asthma with (acute) exacerbation: Secondary | ICD-10-CM

## 2022-04-24 DIAGNOSIS — F411 Generalized anxiety disorder: Secondary | ICD-10-CM | POA: Diagnosis present

## 2022-04-24 DIAGNOSIS — J4542 Moderate persistent asthma with status asthmaticus: Secondary | ICD-10-CM | POA: Diagnosis present

## 2022-04-24 DIAGNOSIS — Z6839 Body mass index (BMI) 39.0-39.9, adult: Secondary | ICD-10-CM

## 2022-04-24 DIAGNOSIS — Z20822 Contact with and (suspected) exposure to covid-19: Secondary | ICD-10-CM | POA: Diagnosis present

## 2022-04-24 DIAGNOSIS — J9602 Acute respiratory failure with hypercapnia: Secondary | ICD-10-CM | POA: Diagnosis present

## 2022-04-24 DIAGNOSIS — J45901 Unspecified asthma with (acute) exacerbation: Secondary | ICD-10-CM | POA: Diagnosis present

## 2022-04-24 DIAGNOSIS — F32A Depression, unspecified: Secondary | ICD-10-CM | POA: Diagnosis present

## 2022-04-24 HISTORY — DX: Depression, unspecified: F32.A

## 2022-04-24 HISTORY — DX: Generalized anxiety disorder: F41.1

## 2022-04-24 HISTORY — DX: Unspecified asthma, uncomplicated: J45.909

## 2022-04-24 LAB — CBC WITH DIFFERENTIAL/PLATELET
Abs Immature Granulocytes: 0.04 10*3/uL (ref 0.00–0.07)
Basophils Absolute: 0 10*3/uL (ref 0.0–0.1)
Basophils Relative: 0 %
Eosinophils Absolute: 0 10*3/uL (ref 0.0–0.5)
Eosinophils Relative: 0 %
HCT: 49.3 % (ref 39.0–52.0)
Hemoglobin: 16.4 g/dL (ref 13.0–17.0)
Immature Granulocytes: 1 %
Lymphocytes Relative: 24 %
Lymphs Abs: 1.6 10*3/uL (ref 0.7–4.0)
MCH: 29.2 pg (ref 26.0–34.0)
MCHC: 33.3 g/dL (ref 30.0–36.0)
MCV: 87.9 fL (ref 80.0–100.0)
Monocytes Absolute: 0.7 10*3/uL (ref 0.1–1.0)
Monocytes Relative: 10 %
Neutro Abs: 4.5 10*3/uL (ref 1.7–7.7)
Neutrophils Relative %: 65 %
Platelets: 288 10*3/uL (ref 150–400)
RBC: 5.61 MIL/uL (ref 4.22–5.81)
RDW: 13.8 % (ref 11.5–15.5)
WBC: 6.9 10*3/uL (ref 4.0–10.5)
nRBC: 0 % (ref 0.0–0.2)

## 2022-04-24 LAB — I-STAT VENOUS BLOOD GAS, ED
Acid-Base Excess: 1 mmol/L (ref 0.0–2.0)
Bicarbonate: 29.1 mmol/L — ABNORMAL HIGH (ref 20.0–28.0)
Calcium, Ion: 1.29 mmol/L (ref 1.15–1.40)
HCT: 51 % (ref 39.0–52.0)
Hemoglobin: 17.3 g/dL — ABNORMAL HIGH (ref 13.0–17.0)
O2 Saturation: 41 %
Patient temperature: 98.3
Potassium: 4 mmol/L (ref 3.5–5.1)
Sodium: 141 mmol/L (ref 135–145)
TCO2: 31 mmol/L (ref 22–32)
pCO2, Ven: 58.9 mmHg (ref 44–60)
pH, Ven: 7.301 (ref 7.25–7.43)
pO2, Ven: 26 mmHg — CL (ref 32–45)

## 2022-04-24 LAB — BRAIN NATRIURETIC PEPTIDE: B Natriuretic Peptide: 5.7 pg/mL (ref 0.0–100.0)

## 2022-04-24 LAB — COMPREHENSIVE METABOLIC PANEL
ALT: 18 U/L (ref 0–44)
AST: 14 U/L — ABNORMAL LOW (ref 15–41)
Albumin: 4.9 g/dL (ref 3.5–5.0)
Alkaline Phosphatase: 80 U/L (ref 38–126)
Anion gap: 11 (ref 5–15)
BUN: 10 mg/dL (ref 6–20)
CO2: 26 mmol/L (ref 22–32)
Calcium: 10 mg/dL (ref 8.9–10.3)
Chloride: 102 mmol/L (ref 98–111)
Creatinine, Ser: 1 mg/dL (ref 0.61–1.24)
GFR, Estimated: >60 mL/min (ref >60)
Glucose, Bld: 95 mg/dL (ref 70–99)
Potassium: 4 mmol/L (ref 3.5–5.1)
Sodium: 139 mmol/L (ref 135–145)
Total Bilirubin: 0.6 mg/dL (ref 0.3–1.2)
Total Protein: 8.6 g/dL — ABNORMAL HIGH (ref 6.5–8.1)

## 2022-04-24 LAB — MAGNESIUM: Magnesium: 2.6 mg/dL — ABNORMAL HIGH (ref 1.7–2.4)

## 2022-04-24 LAB — LACTIC ACID, PLASMA: Lactic Acid, Venous: 1.6 mmol/L (ref 0.5–1.9)

## 2022-04-24 LAB — RESP PANEL BY RT-PCR (FLU A&B, COVID) ARPGX2
Influenza A by PCR: NEGATIVE
Influenza B by PCR: NEGATIVE
SARS Coronavirus 2 by RT PCR: NEGATIVE

## 2022-04-24 LAB — TROPONIN I (HIGH SENSITIVITY)
Troponin I (High Sensitivity): <2 ng/L (ref <18)
Troponin I (High Sensitivity): <2 ng/L (ref <18)

## 2022-04-24 MED ORDER — SODIUM CHLORIDE 0.9 % IV SOLN
500.0000 mg | Freq: Once | INTRAVENOUS | Status: AC
  Administered 2022-04-24: 500 mg via INTRAVENOUS
  Filled 2022-04-24: qty 5

## 2022-04-24 MED ORDER — ACETAMINOPHEN 650 MG RE SUPP: 650.0000 mg | Freq: Four times a day (QID) | RECTAL | Status: DC | PRN

## 2022-04-24 MED ORDER — LEVALBUTEROL HCL 1.25 MG/0.5ML IN NEBU
1.2500 mg | INHALATION_SOLUTION | Freq: Four times a day (QID) | RESPIRATORY_TRACT | Status: DC
  Administered 2022-04-24 – 2022-04-25 (×2): 1.25 mg via RESPIRATORY_TRACT
  Filled 2022-04-24 (×2): qty 0.5

## 2022-04-24 MED ORDER — SODIUM CHLORIDE 0.9 % IV SOLN
2.0000 g | INTRAVENOUS | Status: DC
  Administered 2022-04-25 – 2022-04-26 (×2): 2 g via INTRAVENOUS
  Filled 2022-04-24 (×2): qty 20

## 2022-04-24 MED ORDER — METHYLPREDNISOLONE SODIUM SUCC 125 MG IJ SOLR
125.0000 mg | Freq: Once | INTRAMUSCULAR | Status: AC
  Administered 2022-04-24: 125 mg via INTRAVENOUS
  Filled 2022-04-24: qty 2

## 2022-04-24 MED ORDER — IPRATROPIUM-ALBUTEROL 0.5-2.5 (3) MG/3ML IN SOLN
RESPIRATORY_TRACT | Status: AC
  Administered 2022-04-24: 3 mL via RESPIRATORY_TRACT
  Filled 2022-04-24: qty 3

## 2022-04-24 MED ORDER — ACETAMINOPHEN 325 MG PO TABS
650.0000 mg | ORAL_TABLET | Freq: Four times a day (QID) | ORAL | Status: DC | PRN
  Administered 2022-04-25 – 2022-04-26 (×2): 650 mg via ORAL
  Filled 2022-04-24 (×2): qty 2

## 2022-04-24 MED ORDER — IPRATROPIUM-ALBUTEROL 0.5-2.5 (3) MG/3ML IN SOLN: 3.0000 mL | Freq: Once | RESPIRATORY_TRACT | Status: AC

## 2022-04-24 MED ORDER — IPRATROPIUM-ALBUTEROL 0.5-2.5 (3) MG/3ML IN SOLN
3.0000 mL | Freq: Once | RESPIRATORY_TRACT | Status: AC
  Administered 2022-04-24: 3 mL via RESPIRATORY_TRACT

## 2022-04-24 MED ORDER — MAGNESIUM SULFATE 2 GM/50ML IV SOLN
2.0000 g | Freq: Once | INTRAVENOUS | Status: AC
  Administered 2022-04-24: 2 g via INTRAVENOUS
  Filled 2022-04-24: qty 50

## 2022-04-24 MED ORDER — ALBUTEROL (5 MG/ML) CONTINUOUS INHALATION SOLN
10.0000 mg/h | INHALATION_SOLUTION | RESPIRATORY_TRACT | Status: DC
  Administered 2022-04-24: 10 mg/h via RESPIRATORY_TRACT
  Filled 2022-04-24: qty 20

## 2022-04-24 MED ORDER — IPRATROPIUM-ALBUTEROL 0.5-2.5 (3) MG/3ML IN SOLN
RESPIRATORY_TRACT | Status: AC
  Filled 2022-04-24: qty 3

## 2022-04-24 MED ORDER — SODIUM CHLORIDE 0.9 % IV SOLN
500.0000 mg | INTRAVENOUS | Status: DC
  Administered 2022-04-25 – 2022-04-26 (×2): 500 mg via INTRAVENOUS
  Filled 2022-04-24 (×2): qty 5

## 2022-04-24 MED ORDER — BENZONATATE 100 MG PO CAPS
200.0000 mg | ORAL_CAPSULE | Freq: Three times a day (TID) | ORAL | Status: DC | PRN
  Administered 2022-04-25: 200 mg via ORAL
  Filled 2022-04-24: qty 2

## 2022-04-24 MED ORDER — SODIUM CHLORIDE 0.9 % IV SOLN: INTRAVENOUS | Status: DC | PRN

## 2022-04-24 MED ORDER — ALBUTEROL SULFATE HFA 108 (90 BASE) MCG/ACT IN AERS: 2.0000 | INHALATION_SPRAY | RESPIRATORY_TRACT | Status: DC | PRN

## 2022-04-24 MED ORDER — METHYLPREDNISOLONE SODIUM SUCC 125 MG IJ SOLR: 80.0000 mg | Freq: Two times a day (BID) | INTRAMUSCULAR | Status: DC

## 2022-04-24 MED ORDER — ALBUTEROL SULFATE (2.5 MG/3ML) 0.083% IN NEBU
INHALATION_SOLUTION | RESPIRATORY_TRACT | Status: AC
  Filled 2022-04-24: qty 12

## 2022-04-24 MED ORDER — LORAZEPAM 2 MG/ML IJ SOLN: 0.5000 mg | Freq: Four times a day (QID) | INTRAMUSCULAR | Status: DC | PRN

## 2022-04-24 MED ORDER — IPRATROPIUM BROMIDE 0.02 % IN SOLN
0.5000 mg | Freq: Four times a day (QID) | RESPIRATORY_TRACT | Status: DC
  Administered 2022-04-24 – 2022-04-25 (×2): 0.5 mg via RESPIRATORY_TRACT
  Filled 2022-04-24 (×2): qty 2.5

## 2022-04-24 MED ORDER — LEVALBUTEROL HCL 1.25 MG/0.5ML IN NEBU
1.2500 mg | INHALATION_SOLUTION | RESPIRATORY_TRACT | Status: DC | PRN
  Administered 2022-04-25 – 2022-04-26 (×2): 1.25 mg via RESPIRATORY_TRACT
  Filled 2022-04-24 (×3): qty 0.5

## 2022-04-24 MED ORDER — AMITRIPTYLINE HCL 25 MG PO TABS
50.0000 mg | ORAL_TABLET | Freq: Every day | ORAL | Status: DC
  Administered 2022-04-25 – 2022-04-26 (×2): 50 mg via ORAL
  Filled 2022-04-24 (×2): qty 2

## 2022-04-24 MED ORDER — ALBUTEROL SULFATE (2.5 MG/3ML) 0.083% IN NEBU: 2.5000 mg | INHALATION_SOLUTION | RESPIRATORY_TRACT | Status: DC | PRN

## 2022-04-24 MED ORDER — SODIUM CHLORIDE 0.9 % IV SOLN
1.0000 g | Freq: Once | INTRAVENOUS | Status: AC
  Administered 2022-04-24: 1 g via INTRAVENOUS
  Filled 2022-04-24: qty 10

## 2022-04-24 MED ORDER — SODIUM CHLORIDE 0.9 % IV SOLN: Freq: Once | INTRAVENOUS | Status: AC

## 2022-04-24 NOTE — ED Triage Notes (Signed)
C/O shortness of breathe intermittently for two days; c/o cough, nonproductive; denies fevers; stated breathing is improved leaning forward. Reported childhood hx of Asthma and after a brain sx in 4/21 he's had more episodes.

## 2022-04-24 NOTE — H&P (Signed)
History and Physical    PLEASE NOTE THAT DRAGON DICTATION SOFTWARE WAS USED IN THE CONSTRUCTION OF THIS NOTE.   Arshad Oberholzer WFU:[id number] DOB: [birth year] DOA: 04/24/2022  PCP: Pcp, No  (will further assess) Patient coming from: home   I have personally briefly reviewed patient's old medical records in [place]  Chief Complaint: Shortness of breath  HPI: Tommy Russell is a 31 y.o. male with medical history significant for mild intermittent asthma, generalized anxiety disorder, who is admitted to [facility] on 04/24/2022 by way of transfer from [facility] emergency department with kidney acquired pneumonia after presenting from home to to the/facility complaining of shortness of breath.  The patient reports 2 days of progressive shortness of breath associated with new onset nonproductive cough, wheezing, subjective fever, in the absence of chills, fully rigors, generalized myalgias.  Shortness of breath significant associated orthopnea, PND, or worsening of peripheral edema.  He also denies any recent calf tenderness or new lower extremity erythema.  No recent trauma or travel.  Over the last 2 days, he notes intermittent right-sided chest discomfort, that is prompted/exacerbated with cough.  This discomfort is nonexertional and nonreproducible with palpation over the anterior aspect of the right chest wall, and he denies any chest pain on the left or substernal areas of the chest.  Not associated any palpitations, diaphoresis, nausea, vomiting, dizziness, presyncope, or syncope.  No recent rash, neck stiffness, abdominal pain, diarrhea.  He has a history of asthma, not on a scheduled treatments at home.  However, in the setting of progressive shortness of breath over the last few days, history of multiple doses of prn albuterol inhaler, without any significant to improvement in his respiratory status.  Denies any known baseline supplemental oxygen  requirement.     [facility] ED Course:  Vital signs in the ED were notable for the following: Dr. Jesusita Oka but 6 of symptoms.  10 9-1 18; systolic blood pressures in the 120s 140s; respiratory rate 20-25; initial oxygen saturation in the high 80s, symptoms improving in the range of 95 to 97% on 2 L nasal cannula.  Labs were notable for the following: CMP notable for sodium 139, bicarb 26, creatinine 1.0, liver enzymes within normal limits.  BNP 5.7 high sensitive troponin I x1 found to be less than 2.  Lactic acid 1.6.  CBC notable for wbc 6900 with 65% neutrophils, hemoglobin 16.4.  COVID-19/influenza PCR negative.  Blood cultures x2 collected prior to initiation of IV antibiotics.  Imaging and additional notable ED work-up: EKG shows sinus tachycardia with heart rate 111, normal intervals and no evidence of T wave or ST changes, including no exacerbation.  Chest x-ray shows focal airspace opacity in the right middle lobe, suggestive of infiltrate concerning for pneumonia, demonstrated no evidence of edema, effusion, or pneumothorax.  While in the ED, the following were administered: Duo nebulizer treatments x3, Solu-Medrol 125 mg IV x1, continuous albuterol nebulizer treatment x1, azithromycin, Rocephin, magnesium sulfate 2 g IV every 2 hours x1 dose.  Subsequently, the patient was admitted for observation to PCU at [facility] for further evaluation and management of community-acquired pneumonia resulting in acute asthma exacerbation and associated with acute hypoxic respiratory distress.     Review of Systems: As per HPI otherwise 10 point review of systems negative.   Past Medical History:  Diagnosis Date   Asthma    Depression    GAD (generalized anxiety disorder)     Past Surgical History:  Procedure Laterality Date  SUBOCCIPITAL CRANIECTOMY CERVICAL LAMINECTOMY N/A 11/29/2019   Procedure: SUBOCCIPITAL CRANIECTOMY CERVICAL LAMINECTOMY/DURAPLASTY FOR RESECTION OF MASSS AND  PLACEMENT OF EXTERNAL VENTRICULAR DRAIN ;  Surgeon: Vallarie Mare, MD;  Location: [facility];  Service: Neurosurgery;  Laterality: N/A;    Social History:  reports that he has been smoking. He has never used smokeless tobacco. He reports current alcohol use. He reports that he does not use drugs.   No Known Allergies  History reviewed. No pertinent family history.  Family history reviewed and n   Prior to Admission medications   Medication Sig Start Date End Date Taking? Authorizing Provider  amitriptyline (ELAVIL) 50 MG tablet TAKE 1 TABLET BY MOUTH EVERYDAY AT BEDTIME 12/03/21   Vaslow, Acey Lav, MD  ibuprofen (ADVIL) 600 MG tablet Take 600 mg by mouth every 8 (eight) hours as needed.    [provider]  predniSONE (DELTASONE) 50 MG tablet Take 1 tablet (50 mg total) by mouth daily with breakfast. 10/12/21   Ventura Sellers, MD     Objective    Physical Exam: Vitals:   04/24/22 1830 04/24/22 1940 04/24/22 1958 04/24/22 2046  BP: 139/83 131/86 130/82 135/83  Pulse: (!) 110 (!) 109 (!) 106 (!) 116  Resp: (!) 27 (!) 25 (!) 21 (!) 22  Temp:    99.6 F (37.6 C)  TempSrc:    Oral  SpO2: 97% 95% 95% 94%  Weight:    118.7 kg  Height:    _0  (1.727 m)    General: appears to be stated age; alert, oriented; mildly increased work of breathing Skin: warm, dry, no rash Head:  AT/[place] Mouth:  Oral mucosa membranes appear moist, normal dentition Neck: supple; trachea midline Heart:  RRR; did not appreciate any M/R/G Lungs: CTAB, did not appreciate any wheezes, rales, or rhonchi Abdomen: + BS; soft, ND, NT Vascular: 2+ pedal pulses b/l; 2+ radial pulses b/l Extremities: no peripheral edema, no muscle wasting Neuro: strength and sensation intact in upper and lower extremities b/l   Labs on Admission: I have personally reviewed following labs and imaging studies  CBC: Recent Labs  Lab 04/24/22 1554 04/24/22 1603  WBC 6.9  --   NEUTROABS 4.5  --   HGB 16.4 17.3*   HCT 49.3 51.0  MCV 87.9  --   PLT 288  --    Basic Metabolic Panel: Recent Labs  Lab 04/24/22 1554 04/24/22 1603  NA 139 141  K 4.0 4.0  CL 102  --   CO2 26  --   GLUCOSE 95  --   BUN 10  --   CREATININE 1.00  --   CALCIUM 10.0  --    GFR: Estimated Creatinine Clearance: 136.4 mL/min (by C-G formula based on SCr of 1 mg/dL). Liver Function Tests: Recent Labs  Lab 04/24/22 1554  AST 14*  ALT 18  ALKPHOS 80  BILITOT 0.6  PROT 8.6*  ALBUMIN 4.9   No results for input(s): "LIPASE", "AMYLASE" in the last 168 hours. No results for input(s): "AMMONIA" in the last 168 hours. Coagulation Profile: No results for input(s): "INR", "PROTIME" in the last 168 hours. Cardiac Enzymes: No results for input(s): "CKTOTAL", "CKMB", "CKMBINDEX", "TROPONINI" in the last 168 hours. BNP (last 3 results) No results for input(s): "PROBNP" in the last 8760 hours. HbA1C: No results for input(s): "HGBA1C" in the last 72 hours. CBG: No results for input(s): "GLUCAP" in the last 168 hours. Lipid Profile: No results for input(s): "CHOL", "HDL", "  [place]", "TRIG", "CHOLHDL", "LDLDIRECT" in the last 72 hours. Thyroid Function Tests: No results for input(s): "TSH", "T4TOTAL", "FREET4", "T3FREE", "THYROIDAB" in the last 72 hours. Anemia Panel: No results for input(s): "VITAMINB12", "FOLATE", "FERRITIN", "TIBC", "IRON", "RETICCTPCT" in the last 72 hours. Urine analysis: No results found for: "COLORURINE", "APPEARANCEUR", "LABSPEC", "PHURINE", "GLUCOSEU", "HGBUR", "BILIRUBINUR", "KETONESUR", "PROTEINUR", "UROBILINOGEN", "NITRITE", "LEUKOCYTESUR"  Radiological Exams on Admission: DG Chest Portable 1 View  Result Date: 04/24/2022 CLINICAL DATA:  Shortness of breath EXAM: PORTABLE CHEST 1 VIEW COMPARISON:  None Available. FINDINGS: There is a focal opacity in the lateral right mid lung. Lungs are otherwise clear. No pleural effusion or pneumothorax. Cardiomediastinal silhouette is within normal  limits. No acute fractures. IMPRESSION: Focal opacity in the lateral right mid lung worrisome for pneumonia. Follow-up x-ray recommended in 4-6 weeks to confirm complete resolution and exclude underlying nodule. Electronically Signed   By: Ronney Asters M.D.   On: 04/24/2022 16:13     EKG: Independently reviewed, with result as described above.    Assessment/Plan    Principal Problem:   CAP (community acquired pneumonia) Active Problems:   Acute asthma exacerbation   Acute respiratory failure with hypoxia (HCC)   Depression   GAD (generalized anxiety disorder)      #) Community-acquired pneumonia: Diagnosed on the basis of 2 days of progressive shortness of breath associated with new onset cough, subjective fevers, suggestion of right middle lobe infiltrate on chest x-ray, suggestive of bacterial process, further commiserated with neutrophilic predominance on CBC differential..  In the absence of objective fever or leukocytosis, SIRS criteria not met for sepsis at this time.  Additionally, initial lactate not elevated.  Blood cultures x2 collected prior to initiation of azithromycin and Rocephin, which will be continued for community-acquired coverage.  No additional evidence of underlying infectious process, including COVID-19/flu and CBC are negative.   Plan: Monitor for results of blood cultures x2.  Abx: Continue azithromycin and Rocephin.  Repeat CBC w/ diff in AM.  Monitor continuous pulse oximetry.  Monitor on telemetry. Prn acetaminophen for fever.  Check procalcitonin level, strep pneumoniae urine antigen, mycoplasma antibodies.  Add on serum magnesium level check serum phosphorus level.  Flutter valve, since from a tree.  Further evaluation and management of resultant acute asthma exacerbation/acute hypoxic respiratory distress, is fully detailed below, including solumedrol, scheduled/as needed nebulizer treatments.  Check UA, UDS.          #) Acute asthma exacerbation: In  the context of documented history of mild intermittent asthma, presentation suggestive of acute exacerbation including evidence of wheezing consistent with bronchospasm as a result of presenting community-acquired pneumonia.  Of note, VBG and [facility] was sent suggestive of respiratory fatigue at this time.  will repeat blood gas to further monitor for resp fatigue.  Not on a scheduled breathing treatments at home. In setting of persistent mild tachycardia, with pursue Xopenex as preferred beta-2 agonist.    Plan: Scheduled Xopenex/Atrovent nebulizer treatments, prn Xopenex, Solu-Medrol.  Check serum magnesium and phosphorus levels.  Repeat VBG in the morning to evaluate for any interval evidence to suggest respiratory fatigue.  Monitor continuous pulse oximetry.  Further evaluation management of presenting community-acquired pneumonia, asthma.  Repeat CMP and CBC in the morning.   prn Tessalon Perles.  chart review for results of any previous PFTs.         #) Acute hypoxic respiratory distress: In context of No known baseline supplemental oxygen requirements of initial O2 sats in the high 80s on room  air , subsequently improving into the mid to high 90s on 2 L of cannula.  Appears multifactorial in etiology, with contributions from community-acquired pneumonia with resultant acute asthma exacerbation, as above.  No clinical or radiographic evidence to suggest acute decompensated heart failure at this time.  ACS also appears less likely in the absence of any atypical chest pain, also noting negative troponin, even after greater than 2 days of shortness of breath, and the absence of any acute ischemic changes on EKG.  Given evidence and chest x-ray clinical presentation, making pulmonary embolism appears less likely at this time, although may consider proceed to D-dimer/CT chest should patient not demonstrate clinical improvement with the above.   Plan: Monitor continuous pulse oximetry.  Further  evaluation and treatment.  Marla Roe is on acute asthma exacerbation, as above, including scheduled/as needed breathing treatments, as outlined above, solumedrol.  Check serum magnesium and phosphorus levels.  Repeat ABG in the morning, as above.  Repeat CMP/CBC in the a.m.. uds.          #) depression/gad: Appears to be on scheduled fluoxetine as well as amitriptyline in addition to prn hydroxyzine for anxiety.  Of note, final patient make medication reconciliation and patient pharmacy currently pending.   Plan phone follow-up for results of outpatient medication reconciliation completion, as above.  As needed IV Ativan for anxiety, given potential for further exacerbation of the patient's respiratory status as a secondary consequence of this.         DVT prophylaxis: SCD's   Code Status: Full code Family Communication: I discussed patient's case with his mother, who was present at bedside. Disposition Plan: Per Rounding Team Consults called: none;  Admission status: obs   PLEASE NOTE THAT DRAGON DICTATION SOFTWARE WAS USED IN THE CONSTRUCTION OF THIS NOTE.   [place] DO [facility] [place]   04/24/2022, 9:30 PM

## 2022-04-24 NOTE — ED Provider Notes (Signed)
[facility] EMERGENCY DEPT Provider Note   CSN: [id number] Arrival date & time: 04/24/22  1510     History  Chief Complaint  Patient presents with   Shortness of Breath    Tommy Russell is a 31 y.o. male.  HPI Patient has past medical history of asthma.  It had been more severe in his youth and more well-controlled is a young adult.  Is not typically required ongoing treatment.  Experiences occasional flares.  However, over the past 2 days symptoms have gotten increasingly worse.  Patient reports he started with some cough and congestion symptoms.  No fever.  He reports that shortness of breath became increasingly severe over the past 2 days.  He did use some of his mother's albuterol before yesterday and symptoms seem to be improving.  Today however symptoms got worse and now he feels very short of breath with no relief from albuterol.  He has not had any steroid therapy.  He reports he does have right sided upper chest pain particularly with coughing.  Is been fairly constant for about 2 days.  No lower extremity swelling or calf pain.    Home Medications Prior to Admission medications   Medication Sig Start Date End Date Taking? Authorizing Provider  amitriptyline (ELAVIL) 50 MG tablet TAKE 1 TABLET BY MOUTH EVERYDAY AT BEDTIME 12/03/21   Vaslow, Acey Lav, MD  ibuprofen (ADVIL) 600 MG tablet Take 600 mg by mouth every 8 (eight) hours as needed.    [provider]  predniSONE (DELTASONE) 50 MG tablet Take 1 tablet (50 mg total) by mouth daily with breakfast. 10/12/21   Vaslow, Acey Lav, MD      Allergies    Patient has no known allergies.    Review of Systems   Review of Systems  Physical Exam Updated Vital Signs BP 133/78 (BP Location: Left Arm)   Pulse (!) 111   Temp 98.3 F (36.8 C) (Oral)   Resp 20   Ht '5\' 8"'$  (1.727 m)   Wt 117.9 kg   SpO2 96%   BMI 39.53 kg/m  Physical Exam Constitutional:      Comments: Patient is alert but moderately  short of breath speaking in short full sentences.  Mental status is clear.  HENT:     Mouth/Throat:     Pharynx: Oropharynx is clear.  Eyes:     Extraocular Movements: Extraocular movements intact.  Cardiovascular:     Comments: Tachycardia.  Heart sounds distant with significant respiratory noise. Pulmonary:     Comments: Moderate increased work of breathing.  Coarse wheezing throughout all lung fields.  Or airflow to the bases. Abdominal:     General: There is no distension.     Palpations: Abdomen is soft.     Tenderness: There is no abdominal tenderness. There is no guarding.  Musculoskeletal:        General: No swelling. Normal range of motion.     Right lower leg: No edema.     Left lower leg: No edema.  Skin:    General: Skin is warm and dry.  Neurological:     General: No focal deficit present.     Mental Status: He is oriented to person, place, and time.     Motor: No weakness.     Coordination: Coordination normal.     ED Results / Procedures / Treatments   Labs (all labs ordered are listed, but only abnormal results are displayed) Labs Reviewed  SARS CORONAVIRUS 2  BY RT PCR  RESP PANEL BY RT-PCR (FLU A&B, COVID) [id number]  CBC WITH DIFFERENTIAL/PLATELET  COMPREHENSIVE METABOLIC PANEL  LACTIC ACID, PLASMA  LACTIC ACID, PLASMA  BRAIN NATRIURETIC PEPTIDE  I-STAT VENOUS BLOOD GAS, ED  TROPONIN I (HIGH SENSITIVITY)    EKG EKG Interpretation  Date/Time:  Saturday April 24 2022 16:13:15 EDT Ventricular Rate:  111 PR Interval:  128 QRS Duration: 93 QT Interval:  315 QTC Calculation: 428 R Axis:   62 Text Interpretation: Sinus tachycardia Borderline T wave abnormalities no sig change from previous Confirmed by Charlesetta Shanks [phone]) on 04/24/2022 6:55:26 PM  Radiology No results found.  Procedures Procedures   CRITICAL CARE Performed by: Charlesetta Shanks   Total critical care time: 60 minutes  Critical care time was exclusive of separately billable  procedures and treating other patients.  Critical care was necessary to treat or prevent imminent or life-threatening deterioration.  Critical care was time spent personally by me on the following activities: development of treatment plan with patient and/or surrogate as well as nursing, discussions with consultants, evaluation of patient's response to treatment, examination of patient, obtaining history from patient or surrogate, ordering and performing treatments and interventions, ordering and review of laboratory studies, ordering and review of radiographic studies, pulse oximetry and re-evaluation of patient's condition.  Medications Ordered in ED Medications  albuterol (VENTOLIN HFA) 108 (90 Base) MCG/ACT inhaler 2 puff (has no administration in time range)  albuterol (PROVENTIL) (2.5 MG/3ML) 0.083% nebulizer solution (has no administration in time range)  magnesium sulfate IVPB 2 g 50 mL (has no administration in time range)  methylPREDNISolone sodium succinate (SOLU-MEDROL) 125 mg/2 mL injection 125 mg (has no administration in time range)  0.9 %  sodium chloride infusion (has no administration in time range)  albuterol (PROVENTIL,VENTOLIN) solution continuous neb (has no administration in time range)  ipratropium-albuterol (DUONEB) 0.5-2.5 (3) MG/3ML nebulizer solution 3 mL (3 mLs Nebulization Given 04/24/22 1525)  ipratropium-albuterol (DUONEB) 0.5-2.5 (3) MG/3ML nebulizer solution 3 mL (3 mLs Nebulization Given 04/24/22 1534)    ED Course/ Medical Decision Making/ A&P                           Medical Decision Making Amount and/or Complexity of Data Reviewed Labs: ordered. Radiology: ordered.  Risk Prescription drug management. Decision regarding hospitalization.  Patient presents with significant shortness of breath.  He has increased work of breathing but clear mental status.  Breath sounds are symmetric but extensive wheezing.  Respiratory had already initiated DuoNeb  therapy.  Patient has history of asthma and symptoms have worsened a lot over the past 2 days.  He does endorse right-sided chest pain.  Differential diagnosis includes status asthmaticus, pneumonia, pneumothorax, pulmonary embolus, ACS, bronchitis.  We will continue with nebulizer therapy continuous, add IV magnesium and Solu-Medrol.   Portable chest x-ray reviewed by myself at bedside.  No pneumothorax or mediastinal widening.  Chest x-ray reviewed by radiology indicates appearance of right upper lobe infiltrate, possible pneumonia.  16: 50 patient is finishing a continuous albuterol treatment.  He has already had 2 DuoNebs preceding this.  He does rate his improvement by about 30 to 40%.  He does however continue to have significant diffuse wheezing and oxygen saturation on nebulizer therapy is 92%.  Patient will require admission for pneumonia with asthma exacerbation.  Based on chest x-ray with right-sided suspected pneumonia by radiology interpretation and patient reporting pain with cough on the right, will treat  as bacterial pneumonia.  COVID testing is negative.  Consult: [facility] hospitalist for admission.        Final Clinical Impression(s) / ED Diagnoses Final diagnoses:  Pneumonia of right upper lobe due to infectious organism  Moderate persistent asthma with status asthmaticus    Rx / DC Orders ED Discharge Orders     None         Charlesetta Shanks, MD 05/02/22 1724

## 2022-04-24 NOTE — ED Notes (Signed)
RT educated pt on smoking cessation. RT also suggested pt seek an appointment with pulmonology for a current PFT to evaluate asthma. Pt verbalizes understanding at this time. Pt respiratory status stable on [place] 2 Lpm w/minimal SOB.

## 2022-04-25 DIAGNOSIS — J189 Pneumonia, unspecified organism: Secondary | ICD-10-CM | POA: Diagnosis present

## 2022-04-25 LAB — CBC WITH DIFFERENTIAL/PLATELET
Abs Immature Granulocytes: 0.04 10*3/uL (ref 0.00–0.07)
Basophils Absolute: 0 10*3/uL (ref 0.0–0.1)
Basophils Relative: 0 %
Eosinophils Absolute: 0 10*3/uL (ref 0.0–0.5)
Eosinophils Relative: 0 %
HCT: 47.3 % (ref 39.0–52.0)
Hemoglobin: 15.2 g/dL (ref 13.0–17.0)
Immature Granulocytes: 1 %
Lymphocytes Relative: 10 %
Lymphs Abs: 0.7 10*3/uL (ref 0.7–4.0)
MCH: 29.4 pg (ref 26.0–34.0)
MCHC: 32.1 g/dL (ref 30.0–36.0)
MCV: 91.5 fL (ref 80.0–100.0)
Monocytes Absolute: 0.3 10*3/uL (ref 0.1–1.0)
Monocytes Relative: 4 %
Neutro Abs: 5.7 10*3/uL (ref 1.7–7.7)
Neutrophils Relative %: 85 %
Platelets: 275 10*3/uL (ref 150–400)
RBC: 5.17 MIL/uL (ref 4.22–5.81)
RDW: 14.1 % (ref 11.5–15.5)
WBC: 6.7 10*3/uL (ref 4.0–10.5)
nRBC: 0 % (ref 0.0–0.2)

## 2022-04-25 LAB — MRSA NEXT GEN BY PCR, NASAL: MRSA by PCR Next Gen: DETECTED — AB

## 2022-04-25 LAB — COMPREHENSIVE METABOLIC PANEL
ALT: 18 U/L (ref 0–44)
AST: 18 U/L (ref 15–41)
Albumin: 4.1 g/dL (ref 3.5–5.0)
Alkaline Phosphatase: 76 U/L (ref 38–126)
Anion gap: 7 (ref 5–15)
BUN: 11 mg/dL (ref 6–20)
CO2: 22 mmol/L (ref 22–32)
Calcium: 9.4 mg/dL (ref 8.9–10.3)
Chloride: 109 mmol/L (ref 98–111)
Creatinine, Ser: 0.83 mg/dL (ref 0.61–1.24)
GFR, Estimated: >60 mL/min (ref >60)
Glucose, Bld: 145 mg/dL — ABNORMAL HIGH (ref 70–99)
Potassium: 5 mmol/L (ref 3.5–5.1)
Sodium: 138 mmol/L (ref 135–145)
Total Bilirubin: 0.7 mg/dL (ref 0.3–1.2)
Total Protein: 8.1 g/dL (ref 6.5–8.1)

## 2022-04-25 LAB — URINALYSIS, COMPLETE (UACMP) WITH MICROSCOPIC
Bacteria, UA: NONE SEEN
Bilirubin Urine: NEGATIVE
Glucose, UA: 50 mg/dL — AB
Hgb urine dipstick: NEGATIVE
Ketones, ur: NEGATIVE mg/dL
Leukocytes,Ua: NEGATIVE
Nitrite: NEGATIVE
Protein, ur: NEGATIVE mg/dL
Specific Gravity, Urine: 1.017 (ref 1.005–1.030)
pH: 6 (ref 5.0–8.0)

## 2022-04-25 LAB — BLOOD GAS, VENOUS
Acid-base deficit: 1.3 mmol/L (ref 0.0–2.0)
Bicarbonate: 24.3 mmol/L (ref 20.0–28.0)
O2 Saturation: 86.3 %
Patient temperature: 37.1
pCO2, Ven: 43 mmHg — ABNORMAL LOW (ref 44–60)
pH, Ven: 7.36 (ref 7.25–7.43)
pO2, Ven: 56 mmHg — ABNORMAL HIGH (ref 32–45)

## 2022-04-25 LAB — STREP PNEUMONIAE URINARY ANTIGEN: Strep Pneumo Urinary Antigen: NEGATIVE

## 2022-04-25 LAB — RAPID URINE DRUG SCREEN, HOSP PERFORMED
Amphetamines: NOT DETECTED
Barbiturates: NOT DETECTED
Benzodiazepines: NOT DETECTED
Cocaine: NOT DETECTED
Opiates: NOT DETECTED
Tetrahydrocannabinol: POSITIVE — AB

## 2022-04-25 LAB — PROCALCITONIN: Procalcitonin: <0.1 ng/mL

## 2022-04-25 LAB — MAGNESIUM: Magnesium: 2.6 mg/dL — ABNORMAL HIGH (ref 1.7–2.4)

## 2022-04-25 LAB — PHOSPHORUS: Phosphorus: 1.3 mg/dL — ABNORMAL LOW (ref 2.5–4.6)

## 2022-04-25 MED ORDER — CHLORHEXIDINE GLUCONATE CLOTH 2 % EX PADS
6.0000 | MEDICATED_PAD | Freq: Every day | CUTANEOUS | Status: DC
  Administered 2022-04-25 – 2022-04-26 (×2): 6 via TOPICAL

## 2022-04-25 MED ORDER — IPRATROPIUM BROMIDE 0.02 % IN SOLN: 0.5000 mg | Freq: Three times a day (TID) | RESPIRATORY_TRACT | Status: DC

## 2022-04-25 MED ORDER — LEVALBUTEROL HCL 1.25 MG/0.5ML IN NEBU
1.2500 mg | INHALATION_SOLUTION | RESPIRATORY_TRACT | Status: DC
  Administered 2022-04-25: 1.25 mg via RESPIRATORY_TRACT
  Filled 2022-04-25: qty 0.5

## 2022-04-25 MED ORDER — MUPIROCIN 2 % EX OINT
1.0000 | TOPICAL_OINTMENT | Freq: Two times a day (BID) | CUTANEOUS | Status: DC
  Administered 2022-04-25 – 2022-04-27 (×5): 1 via NASAL
  Filled 2022-04-25: qty 22

## 2022-04-25 MED ORDER — LEVALBUTEROL HCL 1.25 MG/0.5ML IN NEBU
1.2500 mg | INHALATION_SOLUTION | Freq: Four times a day (QID) | RESPIRATORY_TRACT | Status: DC
  Administered 2022-04-25 – 2022-04-27 (×9): 1.25 mg via RESPIRATORY_TRACT
  Filled 2022-04-25 (×7): qty 0.5

## 2022-04-25 MED ORDER — IPRATROPIUM BROMIDE 0.02 % IN SOLN
0.5000 mg | RESPIRATORY_TRACT | Status: DC
  Administered 2022-04-25: 0.5 mg via RESPIRATORY_TRACT
  Filled 2022-04-25: qty 2.5

## 2022-04-25 MED ORDER — DM-GUAIFENESIN ER 30-600 MG PO TB12
1.0000 | ORAL_TABLET | Freq: Two times a day (BID) | ORAL | Status: DC
  Administered 2022-04-25 – 2022-04-26 (×3): 1 via ORAL
  Filled 2022-04-25 (×3): qty 1

## 2022-04-25 MED ORDER — ORAL CARE MOUTH RINSE: 15.0000 mL | OROMUCOSAL | Status: DC | PRN

## 2022-04-25 MED ORDER — IPRATROPIUM BROMIDE 0.02 % IN SOLN
0.5000 mg | Freq: Four times a day (QID) | RESPIRATORY_TRACT | Status: DC
  Administered 2022-04-25 – 2022-04-27 (×8): 0.5 mg via RESPIRATORY_TRACT
  Filled 2022-04-25 (×8): qty 2.5

## 2022-04-25 MED ORDER — IPRATROPIUM BROMIDE 0.02 % IN SOLN
0.5000 mg | RESPIRATORY_TRACT | Status: DC | PRN
  Administered 2022-04-25 – 2022-04-27 (×3): 0.5 mg via RESPIRATORY_TRACT
  Filled 2022-04-25 (×3): qty 2.5

## 2022-04-25 MED ORDER — LEVALBUTEROL HCL 1.25 MG/0.5ML IN NEBU: 1.2500 mg | INHALATION_SOLUTION | Freq: Three times a day (TID) | RESPIRATORY_TRACT | Status: DC

## 2022-04-25 MED ORDER — METHYLPREDNISOLONE SODIUM SUCC 125 MG IJ SOLR
81.2500 mg | Freq: Every day | INTRAMUSCULAR | Status: DC
  Administered 2022-04-25 – 2022-04-26 (×2): 81.25 mg via INTRAVENOUS
  Filled 2022-04-25 (×2): qty 2

## 2022-04-25 NOTE — Progress Notes (Signed)
PROGRESS NOTE  Tommy Russell  DOB: 1990-12-03  PCP: Kathyrn Lass EUM:[id number]  DOA: 04/24/2022  LOS: 0 days  Hospital Day: 2  Brief narrative: Tommy Russell is a 31 y.o. male with PMH significant for mild intermittent asthma, generalized anxiety disorder, depression. 9/23, patient presented to ED at [facility] with complaint of shortness of breath for 2 days associated with nonproductive cough, wheezing, subjective fever, rigors, generalized myalgia.  Reports breathing is improved by leaning forward.  In the ED, patient was afebrile, heart rate elevated to 111, blood pressure in 130s, breathing low 20s on 2 L oxygen by nasal cannula Labs: VBG with PCO2 elevated to 59, bicarb elevated to 29, pH slightly acidotic at 7.30 CBC/BMP mostly unremarkable, lactic acid normal at 1.6, BNP low at 5.7 Respiratory panel negative for flu or COVID Urine drug screen positive for THC Blood culture was sent Chest x-ray showed focal airspace opacity in the right middle lobe.  Infiltrate concerning for pneumonia. Patient was started on IV antibiotics, IV steroids, breathing treatment Admitted to hospitalist service  Subjective: Patient was seen and examined this morning.  Pleasant young African-American male.  Propped up in bed.  Not in distress.  On low-flow oxygen.  Wife at bedside. Chart reviewed No fever, tachycardia improving to 90s, on 2 L oxygen, hemodynamically stable Last set of blood work from this morning.  VBG with improving pH 7.36, PCO2 improving to 43, bicarbonate normalized to 24.  Phosphorus low at 1.3  Assessment and plan: Community-acquired pneumonia Presented with shortness of breath, wheezing, rigors, subjective fever Chest x-ray with right middle lobe infiltrates Respiratory virus panel negative for COVID or flu WBC count normal, lactic acid level normal Blood culture sent. Continue IV Rocephin/azithromycin for now. Mucinex, flutter valve Recent Labs  Lab 04/24/22 1554 04/24/22 2142  04/25/22 0344  WBC 6.9  --  6.7  LATICACIDVEN 1.6  --   --   PROCALCITON  --  <0.10  --    Acute asthma exacerbation Acute respiratory failure with hypoxia and hypercapnia Reports history of mild intermittent asthma.   Initially had wheezing.  VBG in the ED with acidotic pH and elevated PCO2 level. Currently on 2 L oxygen by nasal cannula.  Wean down as tolerated.  Check ambulatory oxygen requirement Continue IV Solu-Medrol at 40 mg twice daily Bronchodilators  Depression Generalized anxiety disorder Continue Elavil  Goals of care   Code Status: Full Code    Mobility: Encourage ambulation  Skin assessment:     Nutritional status:  Body mass index is 39.79 kg/m.          Diet:  Diet Order             Diet regular Room service appropriate? Yes; Fluid consistency: Thin  Diet effective now                   DVT prophylaxis:  SCDs Start: 04/24/22 2117   Antimicrobials: IV ceftriaxone, azithromycin Fluid: None Consultants: None Family Communication: Wife at bedside  Status is: Inpatient  Continue in-hospital care because: Needs IV antibiotics, IV steroids Level of care: Progressive   Dispo: The patient is from: Home              Anticipated d/c is to: Hopefully home in 1 to 2 days              Patient currently is not medically stable to d/c.   Difficult to place patient No     Infusions:  sodium chloride Stopped (04/24/22 2100)   azithromycin     cefTRIAXone (ROCEPHIN)  IV      Scheduled Meds:  amitriptyline  50 mg Oral QHS   Chlorhexidine Gluconate Cloth  6 each Topical Q0600   dextromethorphan-guaiFENesin  1 tablet Oral BID   ipratropium  0.5 mg Nebulization QID   levalbuterol  1.25 mg Nebulization QID   methylPREDNISolone (SOLU-MEDROL) injection  81.25 mg Intravenous Daily   mupirocin ointment  1 Application Nasal BID    PRN meds: sodium chloride, acetaminophen **OR** acetaminophen, levalbuterol, LORazepam, mouth rinse    Antimicrobials: Anti-infectives (From admission, onward)    Start     Dose/Rate Route Frequency Ordered Stop   04/25/22 1800  cefTRIAXone (ROCEPHIN) 2 g in sodium chloride 0.9 % 100 mL IVPB        2 g 200 mL/hr over 30 Minutes Intravenous Every 24 hours 04/24/22 2128     04/25/22 1800  azithromycin (ZITHROMAX) 500 mg in sodium chloride 0.9 % 250 mL IVPB        500 mg 250 mL/hr over 60 Minutes Intravenous Every 24 hours 04/24/22 2128     04/24/22 1645  cefTRIAXone (ROCEPHIN) 1 g in sodium chloride 0.9 % 100 mL IVPB        1 g 200 mL/hr over 30 Minutes Intravenous  Once 04/24/22 1641 04/24/22 1812   04/24/22 1645  azithromycin (ZITHROMAX) 500 mg in sodium chloride 0.9 % 250 mL IVPB        500 mg 250 mL/hr over 60 Minutes Intravenous  Once 04/24/22 1641 04/24/22 2100       Objective: Vitals:   04/25/22 1217 04/25/22 1346  BP:  128/74  Pulse:  87  Resp:  18  Temp:  98.6 F (37 C)  SpO2: 100% 96%    Intake/Output Summary (Last 24 hours) at 04/25/2022 1415 Last data filed at 04/25/2022 0400 Gross per 24 hour  Intake 398.06 ml  Output 650 ml  Net -251.94 ml   Filed Weights   04/24/22 1519 04/24/22 2046 04/25/22 0500  Weight: 117.9 kg 118.7 kg 118.7 kg   Weight change:  Body mass index is 39.79 kg/m.   Physical Exam: General exam: Pleasant, young African-American male.  Not in pain Skin: No rashes, lesions or ulcers. HEENT: Atraumatic, normocephalic, no obvious bleeding Lungs: Continues to have scattered wheezing more on the right side CVS: Regular rate and rhythm, no murmur GI/Abd soft, nontender, nondistended, bowel sounds present CNS: Alert, awake, oriented x3 Psychiatry: Mood appropriate Extremities: No pedal edema, no calf tenderness  Data Review: I have personally reviewed the laboratory data and studies available.  F/u labs or Unresulted Labs (From admission, onward)     Start     Ordered   04/24/22 2145  Mycoplasma pneumoniae antibody, IgM  Add-on,    AD        04/24/22 2147   04/24/22 1641  Culture, blood (routine x 2)  BLOOD CULTURE X 2,   R      04/24/22 1641            Signed, Terrilee Croak, MD [facility] 04/25/2022

## 2022-04-25 NOTE — Progress Notes (Signed)
Pt instructed on use of Flutter Valve.  Pt demonstrated with good effort and technique.  

## 2022-04-26 ENCOUNTER — Inpatient Hospital Stay (HOSPITAL_COMMUNITY): Payer: Self-pay

## 2022-04-26 MED ORDER — DM-GUAIFENESIN ER 30-600 MG PO TB12
2.0000 | ORAL_TABLET | Freq: Two times a day (BID) | ORAL | Status: DC
  Administered 2022-04-26 – 2022-04-27 (×2): 2 via ORAL
  Filled 2022-04-26 (×2): qty 2

## 2022-04-26 NOTE — Progress Notes (Signed)
PROGRESS NOTE  Tommy Russell  DOB: 10-29-1990  PCP: Kathyrn Lass KGU:[id number]  DOA: 04/24/2022  LOS: 1 day  Hospital Day: 3  Brief narrative: Tommy Russell is a 31 y.o. male with PMH significant for mild intermittent asthma, generalized anxiety disorder, depression. 9/23, patient presented to ED at [facility] with complaint of shortness of breath for 2 days associated with nonproductive cough, wheezing, subjective fever, rigors, generalized myalgia.  Reports breathing is improved by leaning forward.  In the ED, patient was afebrile, heart rate elevated to 111, blood pressure in 130s, breathing low 20s on 2 L oxygen by nasal cannula Labs: VBG with PCO2 elevated to 59, bicarb elevated to 29, pH slightly acidotic at 7.30 CBC/BMP mostly unremarkable, lactic acid normal at 1.6, BNP low at 5.7 Respiratory panel negative for flu or COVID Urine drug screen positive for THC Blood culture was sent Chest x-ray showed focal airspace opacity in the right middle lobe.  Infiltrate concerning for pneumonia. Patient was started on IV antibiotics, IV steroids, breathing treatment Admitted to hospitalist service  Subjective: Patient was seen and examined this morning.   Sitting up at the edge of the bed.  On 2 L oxygen by nasal cannula.  Family at bedside.  They state that earlier in the morning when he tried to get up and walk, his oxygen saturation dropped to 80s and hence the oxygen level was increased from 1 L to 3 L/min. Shallow respiratory effort.  Coughs violently on deep breathing.  Assessment and plan: Community-acquired pneumonia Presented with shortness of breath, wheezing, rigors, subjective fever Chest x-ray with right middle lobe infiltrates Respiratory virus panel negative for COVID or flu WBC count normal, lactic acid level normal Blood culture sent on admission has not shown any growth so far. Continues to have significant symptoms, hypoxia, cough Continue IV Rocephin/azithromycin for  now. Continue Mucinex, flutter valve Repeat chest x-ray today at the request of family. Recent Labs  Lab 04/24/22 1554 04/24/22 2142 04/25/22 0344  WBC 6.9  --  6.7  LATICACIDVEN 1.6  --   --   PROCALCITON  --  <0.10  --    Acute asthma exacerbation Acute respiratory failure with hypoxia and hypercapnia Reports history of mild intermittent asthma.   Initially had wheezing.  VBG in the ED showed acidotic pH and elevated PCO2 level. Currently on 2 L oxygen by nasal cannula.  Wean down as tolerated.  Check ambulatory oxygen requirement Continue IV Solu-Medrol at 80 mg daily Bronchodilators to continue  Depression Generalized anxiety disorder Continue Elavil  Goals of care   Code Status: Full Code    Mobility: Encourage ambulation  Skin assessment:     Nutritional status:  Body mass index is 39.65 kg/m.          Diet:  Diet Order             Diet regular Room service appropriate? Yes; Fluid consistency: Thin  Diet effective now                   DVT prophylaxis:  SCDs Start: 04/24/22 2117   Antimicrobials: IV ceftriaxone, azithromycin Fluid: None Consultants: None Family Communication: Wife and other family member at bedside  Status is: Inpatient  Continue in-hospital care because: Needs IV antibiotics, IV steroids Level of care: Progressive   Dispo: The patient is from: Home              Anticipated d/c is to: Hopefully home in 1 to 2 days  Patient currently is not medically stable to d/c.   Difficult to place patient No     Infusions:   sodium chloride Stopped (04/24/22 2100)   azithromycin Stopped (04/25/22 2000)   cefTRIAXone (ROCEPHIN)  IV 200 mL/hr at 04/26/22 0607    Scheduled Meds:  amitriptyline  50 mg Oral QHS   Chlorhexidine Gluconate Cloth  6 each Topical Q0600   dextromethorphan-guaiFENesin  2 tablet Oral BID   ipratropium  0.5 mg Nebulization QID   levalbuterol  1.25 mg Nebulization QID   methylPREDNISolone  (SOLU-MEDROL) injection  81.25 mg Intravenous Daily   mupirocin ointment  1 Application Nasal BID    PRN meds: sodium chloride, acetaminophen **OR** acetaminophen, ipratropium, levalbuterol, LORazepam, mouth rinse   Antimicrobials: Anti-infectives (From admission, onward)    Start     Dose/Rate Route Frequency Ordered Stop   04/25/22 1800  cefTRIAXone (ROCEPHIN) 2 g in sodium chloride 0.9 % 100 mL IVPB        2 g 200 mL/hr over 30 Minutes Intravenous Every 24 hours 04/24/22 2128     04/25/22 1800  azithromycin (ZITHROMAX) 500 mg in sodium chloride 0.9 % 250 mL IVPB        500 mg 250 mL/hr over 60 Minutes Intravenous Every 24 hours 04/24/22 2128     04/24/22 1645  cefTRIAXone (ROCEPHIN) 1 g in sodium chloride 0.9 % 100 mL IVPB        1 g 200 mL/hr over 30 Minutes Intravenous  Once 04/24/22 1641 04/24/22 1812   04/24/22 1645  azithromycin (ZITHROMAX) 500 mg in sodium chloride 0.9 % 250 mL IVPB        500 mg 250 mL/hr over 60 Minutes Intravenous  Once 04/24/22 1641 04/24/22 2100       Objective: Vitals:   04/26/22 1200 04/26/22 1245  BP:  138/83  Pulse:  (!) 110  Resp:  18  Temp:  98.4 F (36.9 C)  SpO2: 93% 94%    Intake/Output Summary (Last 24 hours) at 04/26/2022 1500 Last data filed at 04/26/2022 1400 Gross per 24 hour  Intake 1008.33 ml  Output 500 ml  Net 508.33 ml   Filed Weights   04/24/22 2046 04/25/22 0500 04/26/22 0600  Weight: 118.7 kg 118.7 kg 118.3 kg   Weight change: 0.363 kg Body mass index is 39.65 kg/m.   Physical Exam: General exam: Pleasant, young African-American male.  Not in pain Skin: No rashes, lesions or ulcers. HEENT: Atraumatic, normocephalic, no obvious bleeding Lungs: Continues to have scattered wheezing more on the right side CVS: Regular rate and rhythm, no murmur GI/Abd soft, nontender, nondistended, bowel sounds present CNS: Alert, awake, oriented x3 Psychiatry: Mood appropriate Extremities: No pedal edema, no calf  tenderness  Data Review: I have personally reviewed the laboratory data and studies available.  F/u labs or Unresulted Labs (From admission, onward)     Start     Ordered   04/24/22 2145  Mycoplasma pneumoniae antibody, IgM  Add-on,   AD        04/24/22 2147   04/24/22 1641  Culture, blood (routine x 2)  BLOOD CULTURE X 2,   R      04/24/22 1641            Signed, Terrilee Croak, MD [facility] 04/26/2022

## 2022-04-26 NOTE — Progress Notes (Signed)
SATURATION QUALIFICATIONS: (This note is used to comply with regulatory documentation for home oxygen)  Patient Saturations on Room Air at Rest = 92%  Patient Saturations on Room Air while Ambulating 90%  Patient Saturations on  Liters of oxygen while Ambulating = not applicable  Please briefly explain why patient needs home oxygen:

## 2022-04-27 MED ORDER — CEFDINIR 300 MG PO CAPS: 300.0000 mg | ORAL_CAPSULE | Freq: Two times a day (BID) | ORAL | 0 refills | Status: AC

## 2022-04-27 MED ORDER — PREDNISONE 10 MG PO TABS: ORAL_TABLET | ORAL | 0 refills | Status: DC

## 2022-04-27 MED ORDER — PREDNISONE 20 MG PO TABS
40.0000 mg | ORAL_TABLET | Freq: Two times a day (BID) | ORAL | Status: DC
  Administered 2022-04-27: 40 mg via ORAL
  Filled 2022-04-27: qty 2

## 2022-04-27 MED ORDER — SACCHAROMYCES BOULARDII 250 MG PO CAPS: 250.0000 mg | ORAL_CAPSULE | Freq: Two times a day (BID) | ORAL | 0 refills | Status: AC

## 2022-04-27 MED ORDER — CEFDINIR 300 MG PO CAPS: 300.0000 mg | ORAL_CAPSULE | Freq: Two times a day (BID) | ORAL | 0 refills | Status: DC

## 2022-04-27 MED ORDER — METHYLPREDNISOLONE SODIUM SUCC 40 MG IJ SOLR: 40.0000 mg | Freq: Every day | INTRAMUSCULAR | Status: DC

## 2022-04-27 MED ORDER — CEFDINIR 300 MG PO CAPS
300.0000 mg | ORAL_CAPSULE | Freq: Two times a day (BID) | ORAL | Status: DC
  Administered 2022-04-27: 300 mg via ORAL
  Filled 2022-04-27: qty 1

## 2022-04-27 MED ORDER — DM-GUAIFENESIN ER 30-600 MG PO TB12: 2.0000 | ORAL_TABLET | Freq: Two times a day (BID) | ORAL | 0 refills | Status: AC

## 2022-04-27 MED ORDER — SACCHAROMYCES BOULARDII 250 MG PO CAPS: 250.0000 mg | ORAL_CAPSULE | Freq: Two times a day (BID) | ORAL | 0 refills | Status: DC

## 2022-04-27 MED ORDER — ALBUTEROL SULFATE (2.5 MG/3ML) 0.083% IN NEBU: 2.5000 mg | INHALATION_SOLUTION | RESPIRATORY_TRACT | 2 refills | Status: DC | PRN

## 2022-04-27 MED ORDER — ALBUTEROL SULFATE HFA 108 (90 BASE) MCG/ACT IN AERS: 2.0000 | INHALATION_SPRAY | Freq: Four times a day (QID) | RESPIRATORY_TRACT | 2 refills | Status: DC | PRN

## 2022-04-27 MED ORDER — DM-GUAIFENESIN ER 30-600 MG PO TB12: 2.0000 | ORAL_TABLET | Freq: Two times a day (BID) | ORAL | 0 refills | Status: DC

## 2022-04-27 NOTE — Progress Notes (Signed)
Tommy Russell to be D/C'd home per MD order. Discussed with the patient and all questions fully answered.  Skin clean, dry and intact without evidence of skin break down, no evidence of skin tears noted.  IV catheters discontinued intact. Site without signs and symptoms of complications. Dressing and pressure applied.  An After Visit Summary was printed and given to the patient.  Patient escorted via [place], and D/C home via private auto.  Tommy Russell  04/27/2022

## 2022-04-27 NOTE — TOC Initial Note (Signed)
Transition of Care [facility]) - Initial/Assessment Note    Patient Details  Name: Tommy Russell MRN: [id number] Date of Birth: 12/13/1990  [facility] CM/SW Contact:    Roseanne Kaufman, RN Phone Number: 04/27/2022, 12:35 PM  Clinical Narrative:    Patient has no PCP               Expected Discharge Plan: Home/Self Care Barriers to Discharge: No Barriers Identified   Patient Goals and CMS Choice     Choice offered to / list presented to : NA  Expected Discharge Plan and Services Expected Discharge Plan: Home/Self Care In-house Referral: NA Discharge Planning Services: CM Consult Post Acute Care Choice: NA Living arrangements for the past 2 months: Single Family Home Expected Discharge Date: 04/27/22               DME Arranged: N/A DME Agency: NA       HH Arranged: NA [place] Agency: NA        Prior Living Arrangements/Services Living arrangements for the past 2 months: Single Family Home Lives with:: Spouse, Minor Children Patient language and need for interpreter reviewed:: Yes Do you feel safe going back to the place where you live?: Yes      Need for Family Participation in Patient Care: No (Comment) Care giver support system in place?: Yes (comment) Current home services: DME (cane (per patient legally blind)) Criminal Activity/Legal Involvement Pertinent to Current Situation/Hospitalization: No - Comment as needed  Activities of Daily Living Home Assistive Devices/Equipment: Cane (specify quad or straight) ADL Screening (condition at time of admission) Patient's cognitive ability adequate to safely complete daily activities?: Yes Is the patient deaf or have difficulty hearing?: No Does the patient have difficulty seeing, even when wearing glasses/contacts?: No Does the patient have difficulty concentrating, remembering, or making decisions?: No Patient able to express need for assistance with ADLs?: Yes Does the patient have difficulty dressing or  bathing?: No Independently performs ADLs?: Yes (appropriate for developmental age) Does the patient have difficulty walking or climbing stairs?: No Weakness of Legs: None Weakness of Arms/Hands: None  Permission Sought/Granted Permission sought to share information with : Case Manager Permission granted to share information with : Yes, Verbal Permission Granted  Share Information with NAME: Case Manager           Emotional Assessment Appearance:: Appears stated age Attitude/Demeanor/Rapport: Gracious Affect (typically observed): Accepting Orientation: : Oriented to Self, Oriented to Place, Oriented to  Time, Oriented to Situation Alcohol / Substance Use: Not Applicable Psych Involvement: No (comment)  Admission diagnosis:  Pneumonia [J18.9] Moderate persistent asthma with status asthmaticus [J45.42] Pneumonia of right upper lobe due to infectious organism [J18.9] Patient Active Problem List   Diagnosis Date Noted   Pneumonia 04/25/2022   CAP (community acquired pneumonia) 04/24/2022   Acute asthma exacerbation 04/24/2022   Acute respiratory failure with hypoxia ([place]) 04/24/2022   Depression    GAD (generalized anxiety disorder)    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

## 2022-04-27 NOTE — TOC Transition Note (Signed)
Transition of Care [facility]) - CM/SW Discharge Note   Patient Details  Name: Blase Beckner MRN: [id number] Date of Birth: 02/15/1991  Transition of Care [facility]) CM/SW Contact:  Roseanne Kaufman, RN Phone Number: 04/27/2022, 12:35 PM   Clinical Narrative:   Spoke with patient who advise his Medicare coverage will be effective 05/02/22. Patient reports he can afford medications and family will transport him home at discharge. Patient uses [facility] on [address]. This RNCM called to schedule hospital follow up PCP appt with [facility] however next available PCP appt is 05/25/22. A representative with [facility] will call patient to schedule an appointment post discharge.     No TOC needs at this time.    Final next level of care: Home/Self Care Barriers to Discharge: No Barriers Identified   Patient Goals and CMS Choice     Choice offered to / list presented to : NA  Discharge Placement                       Discharge Plan and Services In-house Referral: NA Discharge Planning Services: CM Consult Post Acute Care Choice: NA          DME Arranged: N/A DME Agency: NA       HH Arranged: NA HH Agency: NA        Social Determinants of Health (SDOH) Interventions     Readmission Risk Interventions     No data to display

## 2022-04-27 NOTE — Discharge Summary (Addendum)
Physician Discharge Summary  Tommy Russell:[id number] DOB: January 17, 1991 DOA: 04/24/2022  PCP: Pcp, No  Admit date: 04/24/2022 Discharge date: 04/27/2022  Admitted From: Home Discharge disposition: Home  Recommendations at discharge:  Complete antibiotic course with Omnicef 300 mg twice daily for next 7 days along with probiotics Completed a tapering course of prednisone Albuterol rescue inhaler has been prescribed.  Brief narrative: Tommy Russell is a 31 y.o. male with PMH significant for mild intermittent asthma, generalized anxiety disorder, depression. 9/23, patient presented to ED at [facility] with complaint of shortness of breath for 2 days associated with nonproductive cough, wheezing, subjective fever, rigors, generalized myalgia.  Reports breathing is improved by leaning forward.  In the ED, patient was afebrile, heart rate elevated to 111, blood pressure in 130s, breathing low 20s on 2 L oxygen by nasal cannula Labs: VBG with PCO2 elevated to 59, bicarb elevated to 29, pH slightly acidotic at 7.30 CBC/BMP mostly unremarkable, lactic acid normal at 1.6, BNP low at 5.7 Respiratory panel negative for flu or COVID Urine drug screen positive for THC Blood culture was sent Chest x-ray showed focal airspace opacity in the right middle lobe.  Infiltrate concerning for pneumonia. Patient was started on IV antibiotics, IV steroids, breathing treatment Admitted to hospitalist service  Subjective: Patient was seen and examined this morning.   Sitting up at the edge of the bed.  Not in distress.  Not on supplemental oxygen.  Able to ambulate in the hallway without supplemental oxygen.  Cough much better today.  Hospital course: Community-acquired pneumonia Presented with shortness of breath, wheezing, rigors, subjective fever Chest x-ray showed right middle lobe infiltrates Respiratory virus panel negative for COVID or flu WBC count normal, lactic acid level normal Blood culture sent on  admission has not shown any growth so far. Patient's symptoms gradually improved.  Cough has improved.  Wheezing has improved.  Initially required supplemental oxygen by nasal cannula.  Currently able to ambulate on the hallway without oxygen. Repeat x-ray 9/25 showed improvement in aeration. In the hospital, he has been receiving IV Rocephin/azithromycin.  Discharge on 7 more days of oral Omnicef with probiotics. Continue Mucinex, flutter valve at home Repeat chest x-ray today at the request of family. Recent Labs  Lab 04/24/22 1554 04/24/22 2142 04/25/22 0344  WBC 6.9  --  6.7  LATICACIDVEN 1.6  --   --   PROCALCITON  --  <0.10  --    Acute asthma exacerbation Acute respiratory failure with hypoxia and hypercapnia Reports history of mild intermittent asthma.   Initially had wheezing.  VBG in the ED showed acidotic pH and elevated PCO2 level. Initially required supplemental oxygen by nasal cannula.  Currently able to ambulate on the hallway without oxygen.initially treated with IV Solu-Medrol.  Changed to oral prednisone this morning.  Discharged on a tapering course of prednisone. Rescue albuterol inhaler to continue at home.  Depression Generalized anxiety disorder Continue prior meds  Wounds:  - Incision (Closed) 11/29/19 Head (Active)  Date First Assessed/Time First Assessed: 11/29/19 1813   Location: Head    Assessments 11/29/2019  6:45 PM 12/07/2019  8:00 AM  Dressing Type Honeycomb None  Dressing New drainage --  Site / Wound Assessment Dressing in place / Unable to assess Dry  Margins -- Attached edges (approximated)  Closure -- Staples  Drainage Amount Minimal --  Drainage Description Serosanguineous --     No associated orders.     Incision (Closed) 11/29/19 Other (Comment) (Active)  Date First  Assessed/Time First Assessed: 11/29/19 1814   Location: Other (Comment)    Assessments 11/29/2019  8:00 PM 12/07/2019  8:00 AM  Dressing Type Transparent dressing None   Dressing Reinforced;New drainage;Old drainage (marked) --  Site / Wound Assessment Dressing in place / Unable to assess Dry  Margins -- Attached edges (approximated)  Closure -- Staples  Drainage Amount Scant None  Drainage Description Serosanguineous --  Treatment Other (Comment) --     No associated orders.    Discharge Exam:   Vitals:   04/27/22 0500 04/27/22 0502 04/27/22 0808 04/27/22 0811  BP:  121/72    Pulse:  81    Resp:  18    Temp:  98.2 F (36.8 C)    TempSrc:  Oral    SpO2:  97% 93% 93%  Weight: 119.3 kg     Height:        Body mass index is 39.99 kg/m.   General exam: Pleasant, young African-American male.  Not in pain Skin: No rashes, lesions or ulcers. HEENT: Atraumatic, normocephalic, no obvious bleeding Lungs: Clear to auscultation bilaterally today.  No wheezing.  No cough on deep breathing today. CVS: Regular rate and rhythm, no murmur GI/Abd soft, nontender, nondistended, bowel sounds present CNS: Alert, awake, oriented x3 Psychiatry: Mood appropriate Extremities: No pedal edema, no calf tenderness  Follow ups:    Follow-up [place] Follow up.   Contact information: [place] [address] [phone]                Discharge Instructions:   Discharge Instructions     Call MD for:  difficulty breathing, headache or visual disturbances   Complete by: As directed    Call MD for:  extreme fatigue   Complete by: As directed    Call MD for:  hives   Complete by: As directed    Call MD for:  persistant dizziness or light-headedness   Complete by: As directed    Call MD for:  persistant nausea and vomiting   Complete by: As directed    Call MD for:  severe uncontrolled pain   Complete by: As directed    Call MD for:  temperature >100.4   Complete by: As directed    Diet general   Complete by: As directed    Discharge instructions    Complete by: As directed    Recommendations at discharge:   Complete antibiotic course with Omnicef 300 mg twice daily for next 7 days along with probiotics  Completed a tapering course of prednisone  Albuterol rescue inhaler has been prescribed.  General discharge instructions: Follow with Primary MD Pcp, No in 7 days  Please request your PCP  to go over your hospital tests, procedures, radiology results at the follow up. Please get your medicines reviewed and adjusted.  Your PCP may decide to repeat certain labs or tests as needed. Do not drive, operate heavy machinery, perform activities at heights, swimming or participation in water activities or provide baby sitting services if your were admitted for syncope or siezures until you have seen by Primary MD or a Neurologist and advised to do so again. [place] Controlled Substance Reporting System database was reviewed. Do not drive, operate heavy machinery, perform activities at heights, swim, participate in water activities or provide baby-sitting services while on medications for pain, sleep and mood until your outpatient physician has reevaluated you  and advised to do so again.  You are strongly recommended to comply with the dose, frequency and duration of prescribed medications. Activity: As tolerated with Full fall precautions use walker/cane & assistance as needed Avoid using any recreational substances like cigarette, tobacco, alcohol, or non-prescribed drug. If you experience worsening of your admission symptoms, develop shortness of breath, life threatening emergency, suicidal or homicidal thoughts you must seek medical attention immediately by calling 911 or calling your MD immediately  if symptoms less severe. You must read complete instructions/literature along with all the possible adverse reactions/side effects for all the medicines you take and that have been prescribed to you. Take any new medicine only after you have  completely understood and accepted all the possible adverse reactions/side effects.  Wear Seat belts while driving. You were cared for by a hospitalist during your hospital stay. If you have any questions about your discharge medications or the care you received while you were in the hospital after you are discharged, you can call the unit and ask to speak with the hospitalist or the covering physician. Once you are discharged, your primary care physician will handle any further medical issues. Please note that NO REFILLS for any discharge medications will be authorized once you are discharged, as it is imperative that you return to your primary care physician (or establish a relationship with a primary care physician if you do not have one).   Increase activity slowly   Complete by: As directed        Discharge Medications:   Allergies as of 04/27/2022   No Known Allergies      Medication List     STOP taking these medications    amitriptyline 50 MG tablet Commonly known as: ELAVIL   ibuprofen 600 MG tablet Commonly known as: ADVIL       TAKE these medications    Airborne Elderberry Chew Chew 1-2 tablets by mouth daily.   albuterol 108 (90 Base) MCG/ACT inhaler Commonly known as: VENTOLIN HFA Inhale 2 puffs into the lungs every 6 (six) hours as needed for wheezing or shortness of breath.   cefdinir 300 MG capsule Commonly known as: OMNICEF Take 1 capsule (300 mg total) by mouth every 12 (twelve) hours for 7 days.   dextromethorphan-guaiFENesin 30-600 MG 12hr tablet Commonly known as: MUCINEX DM Take 2 tablets by mouth 2 (two) times daily for 7 days.   predniSONE 10 MG tablet Commonly known as: DELTASONE Take 4 tablets daily X 2 days, then, Take 3 tablets daily X 2 days, then, Take 2 tablets daily X 2 days, then, Take 1 tablets daily X 1 day. What changed:  medication strength how much to take how to take this when to take this additional instructions    saccharomyces boulardii 250 MG capsule Commonly known as: FLORASTOR Take 1 capsule (250 mg total) by mouth 2 (two) times daily for 10 days.   ZyrTEC Allergy 10 MG tablet Generic drug: cetirizine Take 10 mg by mouth daily.         The results of significant diagnostics from this hospitalization (including imaging, microbiology, ancillary and laboratory) are listed below for reference.    Procedures and Diagnostic Studies:   DG Chest Portable 1 View  Result Date: 04/24/2022 CLINICAL DATA:  Shortness of breath EXAM: PORTABLE CHEST 1 VIEW COMPARISON:  None Available. FINDINGS: There is a focal opacity in the lateral right mid lung. Lungs are otherwise clear. No pleural effusion or pneumothorax. Cardiomediastinal silhouette is within  normal limits. No acute fractures. IMPRESSION: Focal opacity in the lateral right mid lung worrisome for pneumonia. Follow-up x-ray recommended in 4-6 weeks to confirm complete resolution and exclude underlying nodule. Electronically Signed   By: Ronney Asters M.D.   On: 04/24/2022 16:13     Labs:   Basic Metabolic Panel: Recent Labs  Lab 04/24/22 1554 04/24/22 1603 04/24/22 2142 04/25/22 0344  NA 139 141  --  138  K 4.0 4.0  --  5.0  CL 102  --   --  109  CO2 26  --   --  22  GLUCOSE 95  --   --  145*  BUN 10  --   --  11  CREATININE 1.00  --   --  0.83  CALCIUM 10.0  --   --  9.4  MG  --   --  2.6* 2.6*  PHOS  --   --   --  1.3*   GFR Estimated Creatinine Clearance: 164.9 mL/min (by C-G formula based on SCr of 0.83 mg/dL). Liver Function Tests: Recent Labs  Lab 04/24/22 1554 04/25/22 0344  AST 14* 18  ALT 18 18  ALKPHOS 80 76  BILITOT 0.6 0.7  PROT 8.6* 8.1  ALBUMIN 4.9 4.1   No results for input(s): "LIPASE", "AMYLASE" in the last 168 hours. No results for input(s): "AMMONIA" in the last 168 hours. Coagulation profile No results for input(s): "INR", "PROTIME" in the last 168 hours.  CBC: Recent Labs  Lab 04/24/22 1554  04/24/22 1603 04/25/22 0344  WBC 6.9  --  6.7  NEUTROABS 4.5  --  5.7  HGB 16.4 17.3* 15.2  HCT 49.3 51.0 47.3  MCV 87.9  --  91.5  PLT 288  --  275   Cardiac Enzymes: No results for input(s): "CKTOTAL", "CKMB", "CKMBINDEX", "TROPONINI" in the last 168 hours. BNP: Invalid input(s): "POCBNP" CBG: No results for input(s): "GLUCAP" in the last 168 hours. D-Dimer No results for input(s): "DDIMER" in the last 72 hours. Hgb A1c No results for input(s): "HGBA1C" in the last 72 hours. Lipid Profile No results for input(s): "CHOL", "HDL", "LDLCALC", "TRIG", "CHOLHDL", "LDLDIRECT" in the last 72 hours. Thyroid function studies No results for input(s): "TSH", "T4TOTAL", "T3FREE", "THYROIDAB" in the last 72 hours.  Invalid input(s): "FREET3" Anemia work up No results for input(s): "VITAMINB12", "FOLATE", "FERRITIN", "TIBC", "IRON", "RETICCTPCT" in the last 72 hours. Microbiology Recent Results (from the past 240 hour(s))  Resp Panel by RT-PCR (Flu A&B, Covid) Anterior Nasal Swab     Status: None   Collection Time: 04/24/22  3:54 PM   Specimen: Anterior Nasal Swab  Result Value Ref Range Status   SARS Coronavirus 2 by RT PCR NEGATIVE NEGATIVE Final    Comment: (NOTE) SARS-CoV-2 target nucleic acids are NOT DETECTED.  The SARS-CoV-2 RNA is generally detectable in upper respiratory specimens during the acute phase of infection. The lowest concentration of SARS-CoV-2 viral copies this assay can detect is 138 copies/mL. A negative result does not preclude SARS-Cov-2 infection and should not be used as the sole basis for treatment or other patient management decisions. A negative result may occur with  improper specimen collection/handling, submission of specimen other than nasopharyngeal swab, presence of viral mutation(s) within the areas targeted by this assay, and inadequate number of viral copies(<138 copies/mL). A negative result must be combined with clinical observations,  patient history, and epidemiological information. The expected result is Negative.  Fact Sheet for Patients:  EntrepreneurPulse.com.au  Fact Sheet for Healthcare Providers:  IncredibleEmployment.be  This test is no t yet approved or cleared by the [place] FDA and  has been authorized for detection and/or diagnosis of SARS-CoV-2 by FDA under an Emergency Use Authorization (EUA). This EUA will remain  in effect (meaning this test can be used) for the duration of the COVID-19 declaration under Section 564(b)(1) of the Act, 21 U.S.C.section 360bbb-3(b)(1), unless the authorization is terminated  or revoked sooner.       Influenza A by PCR NEGATIVE NEGATIVE Final   Influenza B by PCR NEGATIVE NEGATIVE Final    Comment: (NOTE) The Xpert Xpress SARS-CoV-2/FLU/RSV plus assay is intended as an aid in the diagnosis of influenza from Nasopharyngeal swab specimens and should not be used as a sole basis for treatment. Nasal washings and aspirates are unacceptable for Xpert Xpress SARS-CoV-2/FLU/RSV testing.  Fact Sheet for Patients: EntrepreneurPulse.com.au  Fact Sheet for Healthcare Providers: IncredibleEmployment.be  This test is not yet approved or cleared by the [place] FDA and has been authorized for detection and/or diagnosis of SARS-CoV-2 by FDA under an Emergency Use Authorization (EUA). This EUA will remain in effect (meaning this test can be used) for the duration of the COVID-19 declaration under Section 564(b)(1) of the Act, 21 U.S.C. section 360bbb-3(b)(1), unless the authorization is terminated or revoked.  Performed at [facility], [address] [place], [place] [address]   Culture, blood (routine x 2)     Status: None (Preliminary result)   Collection Time: 04/24/22  5:35 PM   Specimen: BLOOD LEFT HAND  Result Value Ref Range Status   Specimen Description    Final    BLOOD LEFT HAND Performed at [facility], [address], [place], [place] [address]    Special Requests   Final    BOTTLES DRAWN AEROBIC AND ANAEROBIC Blood Culture adequate volume Performed at [facility], [address] [place], [place] [address]    Culture   Final    NO GROWTH 3 DAYS Performed at [facility], [place] [address], [place], [place] [address]    Report Status PENDING  Incomplete  Culture, blood (routine x 2)     Status: None (Preliminary result)   Collection Time: 04/24/22  9:42 PM   Specimen: BLOOD  Result Value Ref Range Status   Specimen Description   Final    BLOOD LEFT ANTECUBITAL Performed at [place] [address] [place], [place] [address]    Special Requests   Final    BOTTLES DRAWN AEROBIC AND ANAEROBIC Blood Culture adequate volume Performed at [place] [address] [place], [place] [address]    Culture   Final    NO GROWTH 2 DAYS Performed at [place] [address], [place], [place] [address]    Report Status PENDING  Incomplete  MRSA Next Gen by PCR, Nasal     Status: Abnormal   Collection Time: 04/25/22  3:50 AM   Specimen: Nasal Mucosa; Nasal Swab  Result Value Ref Range Status   MRSA by PCR Next Gen DETECTED (A) NOT DETECTED Final    Comment: RESULT CALLED TO, READ BACK BY AND VERIFIED WITH: NAVARRO,V. 04/25/22 '@0658'$  BY SEEL,M. (NOTE) The GeneXpert MRSA Assay (FDA approved for NASAL specimens only), is one component of a comprehensive MRSA colonization surveillance program. It is not intended to diagnose MRSA infection nor to guide or monitor treatment for MRSA infections. Test performance  is not FDA approved in patients less than 35 years old. Performed at [facility], [place] [address] [place], [place] [address]     Time coordinating discharge: 35 minutes  Signed: Marlowe Aschoff Kelsey Edman  [facility] 04/27/2022, 12:55 PM

## 2022-04-29 LAB — CULTURE, BLOOD (ROUTINE X 2)
Culture: NO GROWTH
Special Requests: ADEQUATE

## 2022-04-29 NOTE — Telephone Encounter (Signed)
Connected with Rolla Plate [phone]).  Advised of unread portal message with ROI and cover sheet to sign and return through portal or e-mail to [email or web address].  Currently no further questions or needs.

## 2022-04-30 LAB — CULTURE, BLOOD (ROUTINE X 2)
Culture: NO GROWTH
Special Requests: ADEQUATE

## 2022-05-03 LAB — MYCOPLASMA PNEUMONIAE ANTIBODY, IGM: Mycoplasma pneumo IgM: <770 U/mL (ref 0–769)

## 2022-05-17 ENCOUNTER — Inpatient Hospital Stay (INDEPENDENT_AMBULATORY_CARE_PROVIDER_SITE_OTHER): Payer: Self-pay | Admitting: Primary Care

## 2022-05-21 ENCOUNTER — Other Ambulatory Visit: Payer: Self-pay

## 2022-05-21 ENCOUNTER — Ambulatory Visit
Admission: EM | Admit: 2022-05-21 | Discharge: 2022-05-21 | Disposition: A | Discharge status: Home / Self Care | Payer: Medicare Other

## 2022-05-21 ENCOUNTER — Emergency Department (HOSPITAL_COMMUNITY): Payer: Medicare Other

## 2022-05-21 ENCOUNTER — Observation Stay (HOSPITAL_COMMUNITY)
Admission: EM | Admit: 2022-05-21 | Discharge: 2022-05-23 | DRG: 202 | Disposition: A | Discharge status: Home / Self Care | Payer: Medicare Other | Attending: Family Medicine | Admitting: Family Medicine

## 2022-05-21 ENCOUNTER — Encounter (HOSPITAL_COMMUNITY): Payer: Self-pay

## 2022-05-21 DIAGNOSIS — Z6839 Body mass index (BMI) 39.0-39.9, adult: Secondary | ICD-10-CM | POA: Diagnosis not present

## 2022-05-21 DIAGNOSIS — Z85841 Personal history of malignant neoplasm of brain: Secondary | ICD-10-CM

## 2022-05-21 DIAGNOSIS — J45901 Unspecified asthma with (acute) exacerbation: Secondary | ICD-10-CM | POA: Diagnosis present

## 2022-05-21 DIAGNOSIS — Z20822 Contact with and (suspected) exposure to covid-19: Secondary | ICD-10-CM | POA: Diagnosis not present

## 2022-05-21 DIAGNOSIS — F17200 Nicotine dependence, unspecified, uncomplicated: Secondary | ICD-10-CM | POA: Diagnosis present

## 2022-05-21 DIAGNOSIS — Z79899 Other long term (current) drug therapy: Secondary | ICD-10-CM | POA: Diagnosis not present

## 2022-05-21 DIAGNOSIS — J4541 Moderate persistent asthma with (acute) exacerbation: Principal | ICD-10-CM | POA: Diagnosis present

## 2022-05-21 DIAGNOSIS — R17 Unspecified jaundice: Secondary | ICD-10-CM | POA: Diagnosis not present

## 2022-05-21 DIAGNOSIS — J45909 Unspecified asthma, uncomplicated: Secondary | ICD-10-CM | POA: Diagnosis present

## 2022-05-21 DIAGNOSIS — E8809 Other disorders of plasma-protein metabolism, not elsewhere classified: Secondary | ICD-10-CM | POA: Diagnosis present

## 2022-05-21 DIAGNOSIS — R062 Wheezing: Secondary | ICD-10-CM | POA: Diagnosis not present

## 2022-05-21 DIAGNOSIS — E669 Obesity, unspecified: Secondary | ICD-10-CM | POA: Diagnosis not present

## 2022-05-21 DIAGNOSIS — Z23 Encounter for immunization: Secondary | ICD-10-CM | POA: Diagnosis not present

## 2022-05-21 DIAGNOSIS — R0602 Shortness of breath: Secondary | ICD-10-CM

## 2022-05-21 LAB — COMPREHENSIVE METABOLIC PANEL
ALT: 21 U/L (ref 0–44)
AST: 25 U/L (ref 15–41)
Albumin: 4.5 g/dL (ref 3.5–5.0)
Alkaline Phosphatase: 86 U/L (ref 38–126)
Anion gap: 9 (ref 5–15)
BUN: 10 mg/dL (ref 6–20)
CO2: 23 mmol/L (ref 22–32)
Calcium: 9.8 mg/dL (ref 8.9–10.3)
Chloride: 105 mmol/L (ref 98–111)
Creatinine, Ser: 1.11 mg/dL (ref 0.61–1.24)
GFR, Estimated: >60 mL/min (ref >60)
Glucose, Bld: 87 mg/dL (ref 70–99)
Potassium: 4.2 mmol/L (ref 3.5–5.1)
Sodium: 137 mmol/L (ref 135–145)
Total Bilirubin: 1.5 mg/dL — ABNORMAL HIGH (ref 0.3–1.2)
Total Protein: 8.6 g/dL — ABNORMAL HIGH (ref 6.5–8.1)

## 2022-05-21 LAB — CBC WITH DIFFERENTIAL/PLATELET
Abs Immature Granulocytes: 0.02 10*3/uL (ref 0.00–0.07)
Basophils Absolute: 0 10*3/uL (ref 0.0–0.1)
Basophils Relative: 1 %
Eosinophils Absolute: 0 10*3/uL (ref 0.0–0.5)
Eosinophils Relative: 1 %
HCT: 49.1 % (ref 39.0–52.0)
Hemoglobin: 16.3 g/dL (ref 13.0–17.0)
Immature Granulocytes: 0 %
Lymphocytes Relative: 19 %
Lymphs Abs: 1.2 10*3/uL (ref 0.7–4.0)
MCH: 29.6 pg (ref 26.0–34.0)
MCHC: 33.2 g/dL (ref 30.0–36.0)
MCV: 89.3 fL (ref 80.0–100.0)
Monocytes Absolute: 0.6 10*3/uL (ref 0.1–1.0)
Monocytes Relative: 9 %
Neutro Abs: 4.3 10*3/uL (ref 1.7–7.7)
Neutrophils Relative %: 70 %
Platelets: 295 10*3/uL (ref 150–400)
RBC: 5.5 MIL/uL (ref 4.22–5.81)
RDW: 13.9 % (ref 11.5–15.5)
WBC: 6.1 10*3/uL (ref 4.0–10.5)
nRBC: 0 % (ref 0.0–0.2)

## 2022-05-21 LAB — SARS CORONAVIRUS 2 BY RT PCR: SARS Coronavirus 2 by RT PCR: NEGATIVE

## 2022-05-21 MED ORDER — IPRATROPIUM BROMIDE 0.02 % IN SOLN
0.5000 mg | Freq: Once | RESPIRATORY_TRACT | Status: AC
  Administered 2022-05-22: 0.5 mg via RESPIRATORY_TRACT
  Filled 2022-05-21: qty 2.5

## 2022-05-21 MED ORDER — ALBUTEROL SULFATE HFA 108 (90 BASE) MCG/ACT IN AERS: 2.0000 | INHALATION_SPRAY | RESPIRATORY_TRACT | Status: DC | PRN

## 2022-05-21 MED ORDER — PREDNISONE 20 MG PO TABS
60.0000 mg | ORAL_TABLET | Freq: Once | ORAL | Status: AC
  Administered 2022-05-22: 60 mg via ORAL
  Filled 2022-05-21: qty 3

## 2022-05-21 MED ORDER — ALBUTEROL SULFATE (2.5 MG/3ML) 0.083% IN NEBU
5.0000 mg | INHALATION_SOLUTION | Freq: Once | RESPIRATORY_TRACT | Status: AC
  Administered 2022-05-22: 5 mg via RESPIRATORY_TRACT
  Filled 2022-05-21: qty 6

## 2022-05-21 NOTE — ED Triage Notes (Signed)
Pt reports worsening shob. Recently discharged from an admission for pneumonia. Finished course of cefdinir.   Says shob is returning. Wants to r/o returning pneumonia.

## 2022-05-21 NOTE — ED Provider Notes (Signed)
Patient presents today for evaluation of worsening shortness of breath and cough that started about a week ago. He was discharged from 3 day admission on 09/26. He finished steroid taper about one week before symptoms have returned. Given this recommended further evaluation in ED for labs, stat imaging. Patient expresses understanding.    Francene Finders, PA-C 05/21/22 1923

## 2022-05-21 NOTE — ED Notes (Addendum)
Patient is being discharged from the Urgent Care and sent to the Emergency Department via POV with family. Per Doyle Askew, [place], patient is in need of higher level of care due to rule out possible hospital acquired pneumonia. Patient is aware and verbalizes understanding of plan of care.  Vitals:   05/21/22 1914  BP: 119/75  Pulse: 98  Resp: 16  Temp: 98.2 F (36.8 C)  SpO2: 93%

## 2022-05-21 NOTE — ED Provider Triage Note (Signed)
Emergency Medicine Provider Triage Evaluation Note  Tommy Russell , a 31 y.o. male  was evaluated in triage.  Pt complains of worsening shortness of breath and wheezing.  Patient was admitted September 23 to 26 for pneumonia with concurrent asthma exacerbation.  He was treated with antibiotics and steroids which he has completed.  He began having increased cough and throat clearing over the past several days along with increased use of home albuterol treatments.  Breathing was worse tonight, prompting ED visit.  No fevers.  Review of Systems  Positive: Shortness of breath, wheezing Negative: Fever  Physical Exam  BP (!) 133/91   Pulse 97   Temp 98.4 F (36.9 C) (Oral)   Resp 18   Ht '5\' 8"'$  (1.727 m)   Wt 119.3 kg   SpO2 94%   BMI 39.99 kg/m  Gen:   Awake, no distress   Resp:  Normal effort, diminished lung sounds bilaterally with expiratory wheezing throughout MSK:   Moves extremities without difficulty  Other:    Medical Decision Making  Medically screening exam initiated at 8:13 PM.  Appropriate orders placed.  Rolla Plate was informed that the remainder of the evaluation will be completed by another provider, this initial triage assessment does not replace that evaluation, and the importance of remaining in the ED until their evaluation is complete.     Carlisle Cater, PA-C 05/21/22 2014

## 2022-05-21 NOTE — ED Triage Notes (Signed)
Patient presents to UC for cough and SOB x 1 week. States SOB worse the last 2 days. States he was hospitalized for pneumonia 09/23, dc with albuterol, antibiotics, and prednisone. Complete regimen a week ago.   Denies fever.

## 2022-05-22 ENCOUNTER — Encounter (HOSPITAL_COMMUNITY): Payer: Self-pay | Admitting: Internal Medicine

## 2022-05-22 DIAGNOSIS — J4521 Mild intermittent asthma with (acute) exacerbation: Secondary | ICD-10-CM | POA: Diagnosis not present

## 2022-05-22 DIAGNOSIS — E66812 Obesity, class 2: Secondary | ICD-10-CM | POA: Diagnosis present

## 2022-05-22 DIAGNOSIS — E669 Obesity, unspecified: Secondary | ICD-10-CM | POA: Diagnosis not present

## 2022-05-22 DIAGNOSIS — E8809 Other disorders of plasma-protein metabolism, not elsewhere classified: Secondary | ICD-10-CM | POA: Diagnosis present

## 2022-05-22 MED ORDER — IPRATROPIUM-ALBUTEROL 0.5-2.5 (3) MG/3ML IN SOLN
3.0000 mL | Freq: Four times a day (QID) | RESPIRATORY_TRACT | Status: DC
  Administered 2022-05-22 – 2022-05-23 (×6): 3 mL via RESPIRATORY_TRACT
  Filled 2022-05-22 (×6): qty 3

## 2022-05-22 MED ORDER — LORATADINE 10 MG PO TABS
10.0000 mg | ORAL_TABLET | Freq: Every day | ORAL | Status: DC
  Administered 2022-05-22 – 2022-05-23 (×2): 10 mg via ORAL
  Filled 2022-05-22 (×2): qty 1

## 2022-05-22 MED ORDER — MAGNESIUM SULFATE 2 GM/50ML IV SOLN
2.0000 g | Freq: Once | INTRAVENOUS | Status: AC
  Administered 2022-05-22: 2 g via INTRAVENOUS
  Filled 2022-05-22: qty 50

## 2022-05-22 MED ORDER — ALBUTEROL SULFATE (2.5 MG/3ML) 0.083% IN NEBU
2.5000 mg | INHALATION_SOLUTION | RESPIRATORY_TRACT | Status: DC | PRN
  Administered 2022-05-23: 2.5 mg via RESPIRATORY_TRACT
  Filled 2022-05-22: qty 3

## 2022-05-22 MED ORDER — MONTELUKAST SODIUM 10 MG PO TABS
10.0000 mg | ORAL_TABLET | Freq: Every day | ORAL | Status: DC
  Administered 2022-05-22: 10 mg via ORAL
  Filled 2022-05-22: qty 1

## 2022-05-22 MED ORDER — ONDANSETRON HCL 4 MG/2ML IJ SOLN: 4.0000 mg | Freq: Four times a day (QID) | INTRAMUSCULAR | Status: DC | PRN

## 2022-05-22 MED ORDER — ALBUTEROL SULFATE (2.5 MG/3ML) 0.083% IN NEBU
10.0000 mg/h | INHALATION_SOLUTION | RESPIRATORY_TRACT | Status: DC
  Administered 2022-05-22: 10 mg/h via RESPIRATORY_TRACT
  Filled 2022-05-22 (×2): qty 12

## 2022-05-22 MED ORDER — ONDANSETRON HCL 4 MG PO TABS: 4.0000 mg | ORAL_TABLET | Freq: Four times a day (QID) | ORAL | Status: DC | PRN

## 2022-05-22 MED ORDER — IPRATROPIUM-ALBUTEROL 0.5-2.5 (3) MG/3ML IN SOLN
3.0000 mL | RESPIRATORY_TRACT | Status: AC
  Administered 2022-05-22 (×3): 3 mL via RESPIRATORY_TRACT
  Filled 2022-05-22: qty 9

## 2022-05-22 MED ORDER — ACETAMINOPHEN 325 MG PO TABS: 650.0000 mg | ORAL_TABLET | Freq: Four times a day (QID) | ORAL | Status: DC | PRN

## 2022-05-22 MED ORDER — PNEUMOCOCCAL 20-VAL CONJ VACC 0.5 ML IM SUSY
0.5000 mL | PREFILLED_SYRINGE | INTRAMUSCULAR | Status: AC
  Administered 2022-05-23: 0.5 mL via INTRAMUSCULAR
  Filled 2022-05-22: qty 0.5

## 2022-05-22 MED ORDER — ACETAMINOPHEN 650 MG RE SUPP: 650.0000 mg | Freq: Four times a day (QID) | RECTAL | Status: DC | PRN

## 2022-05-22 MED ORDER — ENOXAPARIN SODIUM 40 MG/0.4ML IJ SOSY
40.0000 mg | PREFILLED_SYRINGE | INTRAMUSCULAR | Status: DC
  Administered 2022-05-22: 40 mg via SUBCUTANEOUS
  Filled 2022-05-22: qty 0.4

## 2022-05-22 MED ORDER — PREDNISONE 20 MG PO TABS
40.0000 mg | ORAL_TABLET | Freq: Every day | ORAL | Status: DC
  Administered 2022-05-23: 40 mg via ORAL
  Filled 2022-05-22: qty 2

## 2022-05-22 MED ORDER — PREDNISONE 20 MG PO TABS
40.0000 mg | ORAL_TABLET | Freq: Once | ORAL | Status: AC
  Administered 2022-05-22: 40 mg via ORAL
  Filled 2022-05-22: qty 2

## 2022-05-22 NOTE — Progress Notes (Signed)
Patient admitted to 1615 in NAD. On 2-3L O2. 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 Attached to tele and cont SpO2 sating 97%. Patient and family oriented to unit and call bell. All needs within reach.

## 2022-05-22 NOTE — H&P (Signed)
History and Physical    Patient: Tommy Russell PFX:[id number] DOB: 1991-02-03 DOA: 05/21/2022 DOS: the patient was seen and examined on 05/22/2022 PCP: Pcp, No  Patient coming from: Home  Chief Complaint:  Chief Complaint  Patient presents with   Shortness of Breath   HPI: Tommy Russell is a 31 y.o. male with medical history significant of asthma, depression, class II obesity with a BMI of 39.99 kg/m, recently discharged for CAP last month who is coming to the emergency department with progressively worse dyspnea associated with occasional productive cough and wheezing for about a week that have been particularly worse the last 2 days despite using bronchodilators at home.  No travel history or sick contacts. He denied fever, chills or hemoptysis.  Positive pleuritic, no typical chest pain, palpitations, diaphoresis, PND, orthopnea or pitting edema of the lower extremities.  No abdominal pain, nausea, emesis, diarrhea, constipation, melena or hematochezia.  No flank pain, dysuria, frequency or hematuria.  No polyuria, polydipsia, polyphagia or blurred vision.   ED course: Initial vital signs were temperature 98.4 F, pulse 97, respiration 18, BP 133/91 mmHg O2 sat 94% on room air.  The patient received bronchodilators, magnesium sulfate, prednisone 60 mg yesterday and 40 mg this morning.  Lab work: His CBC was normal.  CMP showed a total protein of 8.6 g/dL and total bilirubin 1.5 mg/deciliter, the rest of the CMP measurements were normal.  Coronavirus PCR was negative.  Imaging: Two-view chest radiograph with no active cardiopulmonary disease.   Review of Systems: As mentioned in the history of present illness. All other systems reviewed and are negative. Past Medical History:  Diagnosis Date   Asthma    Depression    GAD (generalized anxiety disorder)    Past Surgical History:  Procedure Laterality Date   SUBOCCIPITAL CRANIECTOMY CERVICAL LAMINECTOMY N/A 11/29/2019   Procedure:  SUBOCCIPITAL CRANIECTOMY CERVICAL LAMINECTOMY/DURAPLASTY FOR RESECTION OF MASSS AND PLACEMENT OF EXTERNAL VENTRICULAR DRAIN ;  Surgeon: Vallarie Mare, MD;  [facility];  Service: Neurosurgery;  Laterality: N/A;   Social History:  reports that he has been smoking. He has never used smokeless tobacco. He reports current alcohol use. He reports that he does not use drugs.  No Known Allergies  History reviewed. No pertinent family history.  Prior to Admission medications   Medication Sig Start Date End Date Taking? Authorizing Provider  albuterol (PROVENTIL) (2.5 MG/3ML) 0.083% nebulizer solution Take 3 mLs (2.5 mg total) by nebulization every 4 (four) hours as needed for wheezing or shortness of breath. 04/27/22 07/26/22 Yes Dahal, Marlowe Aschoff, MD  albuterol (VENTOLIN HFA) 108 (90 Base) MCG/ACT inhaler Inhale 2 puffs into the lungs every 6 (six) hours as needed for wheezing or shortness of breath. 04/27/22  Yes Dahal, Marlowe Aschoff, MD  Misc Natural Products (AIRBORNE ELDERBERRY) CHEW Chew 1-2 tablets by mouth daily.   Yes [provider]  ZYRTEC ALLERGY 10 MG tablet Take 10 mg by mouth daily.   Yes [provider]  predniSONE (DELTASONE) 10 MG tablet Take 4 tablets daily X 2 days, then, Take 3 tablets daily X 2 days, then, Take 2 tablets daily X 2 days, then, Take 1 tablets daily X 1 day. Patient not taking: Reported on 05/22/2022 04/27/22   Terrilee Croak, MD    Physical Exam: Vitals:   05/21/22 2003 05/21/22 2241 05/22/22 0420 05/22/22 0425  BP:  (!) 140/84 (!) 147/96 (!) 147/96  Pulse:  (!) 104 (!) 110 90  Resp:  '18 18 20  '$ Temp:  98.8 F (37.1 C)  98.9 F (37.2 C)  TempSrc:  Oral  Oral  SpO2:  92% 94% 94%  Weight: 119.3 kg     Height: '5\' 8"'$  (1.727 m)      Physical Exam Vitals and nursing note reviewed.  Constitutional:      Appearance: He is well-developed. He is obese.  HENT:     Head: Normocephalic.     Nose: No rhinorrhea.     Mouth/Throat:     Mouth: Mucous  membranes are moist.  Eyes:     General: No scleral icterus.    Pupils: Pupils are equal, round, and reactive to light.  Neck:     Vascular: No JVD.  Cardiovascular:     Rate and Rhythm: Normal rate and regular rhythm.     Heart sounds: S1 normal and S2 normal.  Pulmonary:     Effort: Pulmonary effort is normal. No tachypnea or accessory muscle usage.     Breath sounds: Decreased breath sounds, wheezing and rhonchi present. No rales.  Abdominal:     General: Abdomen is protuberant. Bowel sounds are normal.     Palpations: Abdomen is soft.     Tenderness: There is no abdominal tenderness.  Musculoskeletal:     Cervical back: Neck supple.     Right lower leg: No edema.     Left lower leg: No edema.  Skin:    General: Skin is warm and dry.  Neurological:     General: No focal deficit present.     Mental Status: He is alert and oriented to person, place, and time.  Psychiatric:        Mood and Affect: Mood normal.        Behavior: Behavior normal.     Data Reviewed:  There are no new results to review at this time.  Assessment and Plan: Principal Problem:   Acute asthma exacerbation Observation/telemetry Continue supplemental oxygen. Continue Zyrtec or formulary equivalent 10 mg p.o. daily. Begin montelukast 10 mg p.o. at bedtime. Continue prednisone 40 mg p.o. daily in a.m. Scheduled and as needed bronchodilators. Follow-up CBC and chemistry in the morning.   Active Problems:   Class II obesity BMI 39.99 kg/m. Needs lifestyle modifications.   Follow-up with PCP as an outpatient.    Hyperbilirubinemia   Hyperproteinemia Recheck LFTs tomorrow.    Advance Care Planning:   Code Status: Full code.  Consults:   Family Communication:   Severity of Illness: The appropriate patient status for this patient is OBSERVATION. Observation status is judged to be reasonable and necessary in order to provide the required intensity of service to ensure the patient's safety.  The patient's presenting symptoms, physical exam findings, and initial radiographic and laboratory data in the context of their medical condition is felt to place them at decreased risk for further clinical deterioration. Furthermore, it is anticipated that the patient will be medically stable for discharge from the hospital within 2 midnights of admission.   Author: Reubin Milan, MD 05/22/2022 7:30 AM  For on call review [email or web address].   This document was prepared using Dragon voice recognition software and may contain some unintended transcription errors.

## 2022-05-22 NOTE — ED Provider Notes (Signed)
[facility] DEPT Provider Note  CSN: [id number] Arrival date & time: 05/21/22 1947  Chief Complaint(s) Shortness of Breath  HPI Tommy Russell is a 31 y.o. male with a past medical history listed below including asthma who was recently admitted for asthma exacerbation and community-acquired pneumonia.  Since being discharged she has had gradually worsening shortness of breath requiring multiple doses of DuoNebs and albuterol puffs at home.  Shortness of breath is worse with activity and coughing.  He denies any known fevers.  Cough is dry.  No chest pain.  No nausea or vomiting.  No lower extremity edema.  No other physical complaints.  The history is provided by the patient.    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 Authorizing Provider  albuterol (PROVENTIL) (2.5 MG/3ML) 0.083% nebulizer solution Take 3 mLs (2.5 mg total) by nebulization every 4 (four) hours as needed for wheezing or shortness of breath. 04/27/22 07/26/22 Yes Dahal, Marlowe Aschoff, MD  albuterol (VENTOLIN HFA) 108 (90 Base) MCG/ACT inhaler Inhale 2 puffs into the lungs every 6 (six) hours as needed for wheezing or shortness of breath. 04/27/22  Yes Dahal, Marlowe Aschoff, MD  Misc Natural Products (AIRBORNE ELDERBERRY) CHEW Chew 1-2 tablets by mouth daily.   Yes [provider]  ZYRTEC ALLERGY 10 MG tablet Take 10 mg by mouth daily.   Yes [provider]  predniSONE (DELTASONE) 10 MG tablet Take 4 tablets  daily X 2 days, then, Take 3 tablets daily X 2 days, then, Take 2 tablets daily X 2 days, then, Take 1 tablets daily X 1 day. Patient not taking: Reported on 05/22/2022 04/27/22   Terrilee Croak, MD                                                                                                                                    Allergies Patient has no known allergies.  Review of Systems Review of Systems As noted in HPI  Physical Exam Vital Signs  I have reviewed the triage vital signs BP (!) 147/96   Pulse 90   Temp 98.9 F (37.2 C) (Oral)   Resp 20   Ht '5\' 8"'$  (1.727 m)   Wt 119.3 kg   SpO2 94%   BMI 39.99 kg/m   Physical Exam Vitals reviewed.  Constitutional:      General: He is not in acute distress.    Appearance: He is well-developed.  He is not diaphoretic.  HENT:     Head: Normocephalic and atraumatic.     Nose: Nose normal.  Eyes:     General: No scleral icterus.       Right eye: No discharge.        Left eye: No discharge.     Conjunctiva/sclera: Conjunctivae normal.     Pupils: Pupils are equal, round, and reactive to light.  Cardiovascular:     Rate and Rhythm: Normal rate and regular rhythm.     Heart sounds: No murmur heard.    No friction rub. No gallop.  Pulmonary:     Effort: Pulmonary effort is normal. Tachypnea present. No respiratory distress.     Breath sounds: No stridor. Wheezing (diffuse insp and exp) present. No rales.  Abdominal:     General: There is no distension.     Palpations: Abdomen is soft.     Tenderness: There is no abdominal tenderness.  Musculoskeletal:        General: No tenderness.     Cervical back: Normal range of motion and neck supple.  Skin:    General: Skin is warm and dry.     Findings: No erythema or rash.  Neurological:     Mental Status: He is alert and oriented to person, place, and time.     ED Results and Treatments Labs (all labs ordered are listed, but only abnormal results are displayed) Labs Reviewed   COMPREHENSIVE METABOLIC PANEL - Abnormal; Notable for the following components:      Result Value   Total Protein 8.6 (*)    Total Bilirubin 1.5 (*)    All other components within normal limits  SARS CORONAVIRUS 2 BY RT PCR  CBC WITH DIFFERENTIAL/PLATELET                                                                                                                         EKG  EKG Interpretation  Date/Time:    Ventricular Rate:    PR Interval:    QRS Duration:   QT Interval:    QTC Calculation:   R Axis:     Text Interpretation:         Radiology DG Chest 2 View  Result Date: 05/21/2022 CLINICAL DATA:  Shortness of breath, wheezing EXAM: CHEST - 2 VIEW COMPARISON:  04/26/2022 FINDINGS: The heart size and mediastinal contours are within normal limits. Both lungs are clear. The visualized skeletal structures are unremarkable. IMPRESSION: No active cardiopulmonary disease. Electronically Signed   By: Elmer Picker M.D.   On: 05/21/2022 20:29    Medications Ordered in ED Medications  albuterol (VENTOLIN HFA) 108 (90 Base) MCG/ACT inhaler 2 puff (has no administration in time range)  magnesium sulfate IVPB 2 g 50 mL (2 g Intravenous New Bag/Given 05/22/22 0631)  albuterol (PROVENTIL) (2.5 MG/3ML) 0.083% nebulizer solution (10 mg/hr Nebulization New Bag/Given 05/22/22 0636)  predniSONE (DELTASONE) tablet 60 mg (60 mg Oral Given 05/22/22 0341)  albuterol (PROVENTIL) (2.5 MG/3ML) 0.083% nebulizer solution 5 mg (5 mg Nebulization Given 05/22/22 0340)  ipratropium (ATROVENT) nebulizer solution 0.5 mg (0.5 mg Nebulization Given 05/22/22 0339)  ipratropium-albuterol (DUONEB) 0.5-2.5 (3) MG/3ML nebulizer solution 3 mL (3 mLs Nebulization Given 05/22/22 0521)  predniSONE (DELTASONE) tablet 40 mg (40 mg Oral Given 05/22/22 0451)                                                                                                                                     Procedures .Critical  Care  Performed by: Fatima Blank, MD Authorized by: Fatima Blank, MD   Critical care provider statement:    Critical care time (minutes):  45   Critical care time was exclusive of:  Separately billable procedures and treating other patients   Critical care was necessary to treat or prevent imminent or life-threatening deterioration of the following conditions:  Respiratory failure   Critical care was time spent personally by me on the following activities:  Development of treatment plan with patient or surrogate, discussions with consultants, evaluation of patient's response to treatment, examination of patient, obtaining history from patient or surrogate, review of old charts, re-evaluation of patient's condition, pulse oximetry, ordering and review of radiographic studies, ordering and review of laboratory studies and ordering and performing treatments and interventions   Care discussed with: admitting provider     (including critical care time)  Medical Decision Making / ED Course   Medical Decision Making Amount and/or Complexity of Data Reviewed External Data Reviewed: notes.    Details: Admission note from Sept  Labs: ordered. Decision-making details documented in ED Course. Radiology: ordered and independent interpretation performed. Decision-making details documented in ED Course.  Risk Prescription drug management. Parenteral controlled substances. Drug therapy requiring intensive monitoring for toxicity. Decision regarding hospitalization.    Patient present for SOB consistent with asthma exacerbation. Will rule out PNA, PTx.  CBC without leukocytosis or anemia Metabolic panel without significant electrolyte derangement or renal sufficiency COVID-negative Chest x-ray without evidence of pneumonia or pneumothorax  Patient given oral prednisone, DuoNebs x3 with minimal improvement.  Patient is satting low 90% on room air.  Will require additional  treatments and magnesium.  Consulted Dr. [place] Crane from hospitalist service who agreed to admit patient for further management      Final Clinical Impression(s) / ED Diagnoses Final diagnoses:  Moderate persistent asthma with exacerbation           This chart was dictated using voice recognition software.  Despite best efforts to proofread,  errors can occur which can change the documentation meaning.    Fatima Blank, MD 05/22/22 [phone]

## 2022-05-23 ENCOUNTER — Telehealth: Payer: Self-pay | Admitting: Pulmonary Disease

## 2022-05-23 DIAGNOSIS — J4521 Mild intermittent asthma with (acute) exacerbation: Secondary | ICD-10-CM | POA: Diagnosis not present

## 2022-05-23 DIAGNOSIS — E8809 Other disorders of plasma-protein metabolism, not elsewhere classified: Secondary | ICD-10-CM | POA: Diagnosis present

## 2022-05-23 DIAGNOSIS — J45909 Unspecified asthma, uncomplicated: Secondary | ICD-10-CM | POA: Diagnosis present

## 2022-05-23 DIAGNOSIS — F17200 Nicotine dependence, unspecified, uncomplicated: Secondary | ICD-10-CM | POA: Diagnosis present

## 2022-05-23 DIAGNOSIS — Z20822 Contact with and (suspected) exposure to covid-19: Secondary | ICD-10-CM | POA: Diagnosis present

## 2022-05-23 DIAGNOSIS — J4541 Moderate persistent asthma with (acute) exacerbation: Secondary | ICD-10-CM | POA: Diagnosis present

## 2022-05-23 DIAGNOSIS — R17 Unspecified jaundice: Secondary | ICD-10-CM | POA: Diagnosis present

## 2022-05-23 DIAGNOSIS — Z85841 Personal history of malignant neoplasm of brain: Secondary | ICD-10-CM | POA: Diagnosis not present

## 2022-05-23 DIAGNOSIS — Z23 Encounter for immunization: Secondary | ICD-10-CM | POA: Diagnosis not present

## 2022-05-23 DIAGNOSIS — Z6839 Body mass index (BMI) 39.0-39.9, adult: Secondary | ICD-10-CM | POA: Diagnosis not present

## 2022-05-23 DIAGNOSIS — E669 Obesity, unspecified: Secondary | ICD-10-CM | POA: Diagnosis not present

## 2022-05-23 DIAGNOSIS — Z79899 Other long term (current) drug therapy: Secondary | ICD-10-CM | POA: Diagnosis not present

## 2022-05-23 LAB — COMPREHENSIVE METABOLIC PANEL
ALT: 18 U/L (ref 0–44)
AST: 16 U/L (ref 15–41)
Albumin: 4 g/dL (ref 3.5–5.0)
Alkaline Phosphatase: 77 U/L (ref 38–126)
Anion gap: 9 (ref 5–15)
BUN: 15 mg/dL (ref 6–20)
CO2: 25 mmol/L (ref 22–32)
Calcium: 9.1 mg/dL (ref 8.9–10.3)
Chloride: 105 mmol/L (ref 98–111)
Creatinine, Ser: 1.06 mg/dL (ref 0.61–1.24)
GFR, Estimated: >60 mL/min (ref >60)
Glucose, Bld: 103 mg/dL — ABNORMAL HIGH (ref 70–99)
Potassium: 3.7 mmol/L (ref 3.5–5.1)
Sodium: 139 mmol/L (ref 135–145)
Total Bilirubin: 0.7 mg/dL (ref 0.3–1.2)
Total Protein: 7.6 g/dL (ref 6.5–8.1)

## 2022-05-23 LAB — HIV ANTIBODY (ROUTINE TESTING W REFLEX): HIV Screen 4th Generation wRfx: NONREACTIVE

## 2022-05-23 MED ORDER — IPRATROPIUM-ALBUTEROL 0.5-2.5 (3) MG/3ML IN SOLN: 3.0000 mL | RESPIRATORY_TRACT | 1 refills | Status: DC

## 2022-05-23 MED ORDER — IPRATROPIUM-ALBUTEROL 0.5-2.5 (3) MG/3ML IN SOLN
3.0000 mL | RESPIRATORY_TRACT | Status: DC
  Filled 2022-05-23: qty 3

## 2022-05-23 MED ORDER — GUAIFENESIN-DM 100-10 MG/5ML PO SYRP
5.0000 mL | ORAL_SOLUTION | ORAL | Status: DC | PRN
  Administered 2022-05-23: 5 mL via ORAL
  Filled 2022-05-23 (×2): qty 10

## 2022-05-23 MED ORDER — DULERA 100-5 MCG/ACT IN AERO: 2.0000 | INHALATION_SPRAY | Freq: Two times a day (BID) | RESPIRATORY_TRACT | 1 refills | Status: DC

## 2022-05-23 MED ORDER — ALBUTEROL SULFATE HFA 108 (90 BASE) MCG/ACT IN AERS: 2.0000 | INHALATION_SPRAY | Freq: Four times a day (QID) | RESPIRATORY_TRACT | 2 refills | Status: DC | PRN

## 2022-05-23 MED ORDER — MONTELUKAST SODIUM 10 MG PO TABS: 10.0000 mg | ORAL_TABLET | Freq: Every day | ORAL | 3 refills | Status: DC

## 2022-05-23 MED ORDER — PREDNISONE 10 MG PO TABS: ORAL_TABLET | ORAL | 0 refills | Status: DC

## 2022-05-23 NOTE — Assessment & Plan Note (Signed)
-   Continue prednisone - Continue supplemental oxygen - Continue Claritin, Singulair - Continue bronchodilators, scheduled

## 2022-05-23 NOTE — Progress Notes (Signed)
  Progress Note   Patient: Tommy Russell [id number]:[id number] DOB: 06-13-1991 DOA: 05/21/2022     0 DOS: the patient was seen and examined on 05/23/2022 at 11:15AM      Brief hospital course: Mr. Caraway is a 31 y.o. M with hx asthma, anxiety, obesity and pilocytic astrocytoma of cerebellum in 2021 who presented with few days cough, wheezing.  Had recently been admitted for pneumonia 1 month ago.  In ER here, CXR clear.  No fever.  COVID-.  Normal on room air but still dramatic symptoms without improvement in the ER, so hospitalists asked to observe overnight.  Wheezing and admitted on steroids and bronchodilators.     Assessment and Plan: * Acute asthma exacerbation - Continue prednisone - Continue supplemental oxygen - Continue Claritin, Singulair - Continue bronchodilators, scheduled  Class II obesity BMI 39          Subjective: Patient still wheezing, still out of breath.  Desaturated with room air this morning.  Had a nosebleed this morning, had some scant hemoptysis.     Physical Exam: BP 131/85 (BP Location: Left Arm)   Pulse (!) 104   Temp 99 F (37.2 C) (Oral)   Resp 13   Ht '5\' 8"'$  (1.727 m)   Wt 119.3 kg   SpO2 93%   BMI 39.99 kg/m   Adult male, lying in bed, getting a nosebleed to stop RRR, no murmurs, no peripheral edema Respiratory rate seems increased with exertion, he has wheezing on the left Abdomen soft no tenderness palpation or guarding Attention normal, affect normal, judgment insight appear normal  Data Reviewed: Comprehensive metabolic panel normal  Family Communication: Mother at the bedside    Disposition: Status is: Observation The patient has continuing tachycardia to the 100s, he also has hypoxia this morning at rest.  (Does not use oxygen at home)  We will continue with supplemental oxygen, in-hospital treatments.        Author: Edwin Dada, MD 05/23/2022 1:54 PM  For on call review [email or web address].

## 2022-05-23 NOTE — Hospital Course (Signed)
Tommy Russell is a 31 y.o. M with hx asthma, anxiety, obesity and pilocytic astrocytoma of cerebellum in 2021 who presented with few days cough, wheezing.  Had recently been admitted for pneumonia 1 month ago.  In ER here, CXR clear.  No fever.  COVID-.  Normal on room air but still dramatic symptoms without improvement in the ER, so hospitalists asked to observe overnight.  Wheezing and admitted on steroids and bronchodilators.

## 2022-05-23 NOTE — Assessment & Plan Note (Signed)
BMI 39 

## 2022-05-23 NOTE — Progress Notes (Signed)
Patient being discharged home with family. PIV removed per order. Discharge teaching complete and all questions answered. Patient sent home with all personal belongings.

## 2022-05-23 NOTE — Plan of Care (Signed)
  Problem: Education: Goal: Knowledge of disease or condition will improve Outcome: Adequate for Discharge Goal: Knowledge of the prescribed therapeutic regimen will improve Outcome: Adequate for Discharge Goal: Individualized Educational Video(s) Outcome: Adequate for Discharge   Problem: Activity: Goal: Ability to tolerate increased activity will improve Outcome: Adequate for Discharge Goal: Will verbalize the importance of balancing activity with adequate rest periods Outcome: Adequate for Discharge   Problem: Respiratory: Goal: Ability to maintain a clear airway will improve Outcome: Adequate for Discharge Goal: Levels of oxygenation will improve Outcome: Adequate for Discharge Goal: Ability to maintain adequate ventilation will improve Outcome: Adequate for Discharge   Problem: Education: Goal: Knowledge of General Education information will improve Description: Including pain rating scale, medication(s)/side effects and non-pharmacologic comfort measures Outcome: Adequate for Discharge   Problem: Health Behavior/Discharge Planning: Goal: Ability to manage health-related needs will improve Outcome: Adequate for Discharge   Problem: Clinical Measurements: Goal: Ability to maintain clinical measurements within normal limits will improve Outcome: Adequate for Discharge Goal: Will remain free from infection Outcome: Adequate for Discharge Goal: Diagnostic test results will improve Outcome: Adequate for Discharge Goal: Respiratory complications will improve Outcome: Adequate for Discharge Goal: Cardiovascular complication will be avoided Outcome: Adequate for Discharge   Problem: Activity: Goal: Risk for activity intolerance will decrease Outcome: Adequate for Discharge   Problem: Nutrition: Goal: Adequate nutrition will be maintained Outcome: Adequate for Discharge   Problem: Nutrition: Goal: Adequate nutrition will be maintained Outcome: Adequate for Discharge    Problem: Coping: Goal: Level of anxiety will decrease Outcome: Adequate for Discharge   Problem: Elimination: Goal: Will not experience complications related to bowel motility Outcome: Adequate for Discharge Goal: Will not experience complications related to urinary retention Outcome: Adequate for Discharge   Problem: Pain Managment: Goal: General experience of comfort will improve Outcome: Adequate for Discharge   Problem: Safety: Goal: Ability to remain free from injury will improve Outcome: Adequate for Discharge   Problem: Skin Integrity: Goal: Risk for impaired skin integrity will decrease Outcome: Adequate for Discharge

## 2022-05-23 NOTE — Progress Notes (Signed)
Attempted to wean patient to RA this AM. Patient on 2L at 92-94%. On RA patient desatted to 88-90%. Remains on 1L O2 at 92%.

## 2022-05-23 NOTE — Telephone Encounter (Signed)
Received message from hospitalist.  31 yr old with asthma and repeat hospital admissions.  Needs next available consult appointment.  Can be with an physician.

## 2022-05-23 NOTE — Progress Notes (Signed)
Weaned patient to RA at rest. Sating 94%. Ambulated in hallway on RA sating 92-95%.

## 2022-05-23 NOTE — Discharge Summary (Signed)
Physician Discharge Summary   Patient: Tommy Russell MRN: [id number] DOB: [birth year]  Admit date:     05/21/2022  Discharge date: 05/23/22  Discharge Physician: Edwin Dada   PCP: Pcp, No     Recommendations at discharge:  Follow up with Pulmonology when referral completed     Discharge Diagnoses: Principal Problem:   Acute asthma exacerbation Active Problems:   Class II obesity        Hospital Course: Tommy Russell is a 31 y.o. 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  Had recently been admitted for pneumonia 1 month ago.  In ER here, patient in significant respiratory distress, EDP requested observation overnight.  Intermittently hypoxic to 80s.  CXR clear.  No fever.  COVID-.       * Acute asthma exacerbation Overnight, patient improved with steroids, bronchodilators.  Able to ambulate without O2.    Given second admission to hospital in 30 days, was discharged on prednisone taper, ICS/LABA and Pulmonology referral was requested.    Class II obesity BMI 39            [facility] Controlled Substances Registry was reviewed for this patient prior to discharge.   Procedures performed: None  Disposition: Home   DISCHARGE MEDICATION: Allergies as of 05/23/2022   No Known Allergies      Medication List     TAKE these medications    Airborne Elderberry Chew Chew 1-2 tablets by mouth daily.   albuterol 108 (90 Base) MCG/ACT inhaler Commonly known as: VENTOLIN HFA Inhale 2 puffs into the lungs every 6 (six) hours as needed for wheezing or shortness of breath. What changed: Another medication with the same name was removed. Continue taking this medication, and follow the directions you see here.   Dulera 100-5 MCG/ACT Aero Generic drug:  mometasone-formoterol Inhale 2 puffs into the lungs in the morning and at bedtime.   ipratropium-albuterol 0.5-2.5 (3) MG/3ML Soln Commonly known as: DUONEB Take 3 mLs by nebulization every 4 (four) hours.   montelukast 10 MG tablet Commonly known as: SINGULAIR Take 1 tablet (10 mg total) by mouth at bedtime.   predniSONE 10 MG tablet Commonly known as: DELTASONE Take prednisone 40 mg (4 tabs) daily for 3 days then take prednisone 30 mg (3 tabs) for one day then 20 mg (2 tabs) for 1 day then 10 mg (1 tab) for one day then stop Start taking on: May 24, 2022   ZyrTEC Allergy 10 MG tablet Generic drug: cetirizine Take 10 mg by mouth daily.         Discharge Instructions     Discharge instructions   Complete by: As directed    IMPORTANT DISCHARGE INSTRUCTIONS   From Dr. Loleta Books: You were admitted for an asthma exacerbation You had a chest x-ray that was thankfully normal, showed no evidence of pneumonia.   Here, you were treated with steroids and Duonebs.    You should take prednisone/steroid in the following taper: Take prednisone 40 mg (4 tabs) daily for the next 3 days (Mon, Tues, Weds) then  Take prednisone 30 mg (3 tabs) Thurs then  Take prednisone 20 mg (2 tabs) Fri then Take prednisone 10 mg (1 tab) Sat then stop     For the next week, I recommend you take Duoneb in the nebulizer three times daily  plus as needed  After a week, you should reduce to as needed use only  Start the inhaler Dulera twice daily  I will send the referral to the Pulmonology office for follow up  You should set up a primary care doctor appoitnment as soon as you can   Increase activity slowly   Complete by: As directed        Discharge Exam: Filed Weights   05/21/22 2003  Weight: 119.3 kg    General: Pt is alert, awake, not in acute distress Cardiovascular: RRR, nl S1-S2, no murmurs appreciated.   No LE edema.   Respiratory: Normal respiratory rate and rhythm.  Mild  wheezing, improevd. Abdominal: Abdomen soft and non-tender.  No distension or HSM.   Neuro/Psych: Strength symmetric in upper and lower extremities.  Judgment and insight appear normal.   Condition at discharge: good  The results of significant diagnostics from this hospitalization (including imaging, microbiology, ancillary and laboratory) are listed below for reference.   Imaging Studies: DG Chest 2 View  Result Date: 05/21/2022 CLINICAL DATA:  Shortness of breath, wheezing EXAM: CHEST - 2 VIEW COMPARISON:  04/26/2022 FINDINGS: The heart size and mediastinal contours are within normal limits. Both lungs are clear. The visualized skeletal structures are unremarkable. IMPRESSION: No active cardiopulmonary disease. Electronically Signed   By: Elmer Picker M.D.   On: 05/21/2022 20:29   DG Chest 2 View  Result Date: 04/26/2022 CLINICAL DATA:  31 year old male with shortness of breath EXAM: CHEST - 2 VIEW COMPARISON:  04/24/2022 FINDINGS: Cardiomediastinal silhouette unchanged in size and contour. No evidence of central vascular congestion. No interlobular septal thickening. No pneumothorax or pleural effusion. Improved aeration in the lungs compared to the prior. No new confluent airspace disease. No acute displaced fracture IMPRESSION: Improved aeration in the lungs, suggesting resolution of prior airspace disease, and no evidence on the current of acute cardiopulmonary disease Electronically Signed   By: Corrie Mckusick D.O.   On: 04/26/2022 15:39   DG Chest Portable 1 View  Result Date: 04/24/2022 CLINICAL DATA:  Shortness of breath EXAM: PORTABLE CHEST 1 VIEW COMPARISON:  None Available. FINDINGS: There is a focal opacity in the lateral right mid lung. Lungs are otherwise clear. No pleural effusion or pneumothorax. Cardiomediastinal silhouette is within normal limits. No acute fractures. IMPRESSION: Focal opacity in the lateral right mid lung worrisome for pneumonia. Follow-up x-ray  recommended in 4-6 weeks to confirm complete resolution and exclude underlying nodule. Electronically Signed   By: Ronney Asters M.D.   On: 04/24/2022 16:13    Microbiology: Results for orders placed or performed during the hospital encounter of 05/21/22  SARS Coronavirus 2 by RT PCR (hospital order, performed in [facility] hospital lab) *cepheid single result test* Anterior Nasal Swab     Status: None   Collection Time: 05/21/22  9:08 PM   Specimen: Anterior Nasal Swab  Result Value Ref Range Status   SARS Coronavirus 2 by RT PCR NEGATIVE NEGATIVE Final    Comment: (NOTE) SARS-CoV-2 target nucleic acids are NOT DETECTED.  The SARS-CoV-2 RNA is generally detectable in upper and lower respiratory specimens during the acute phase of infection. The lowest concentration of SARS-CoV-2 viral copies this assay can detect is 250 copies / mL. A negative result does not preclude SARS-CoV-2 infection and should not be used as the sole basis for treatment or other patient management decisions.  A negative result may occur with improper specimen collection / handling, submission of specimen other than  nasopharyngeal swab, presence of viral mutation(s) within the areas targeted by this assay, and inadequate number of viral copies (<250 copies / mL). A negative result must be combined with clinical observations, patient history, and epidemiological information.  Fact Sheet for Patients:   https://www.patel.info/  Fact Sheet for Healthcare Providers: https://hall.com/  This test is not yet approved or  cleared by the [place] FDA and has been authorized for detection and/or diagnosis of SARS-CoV-2 by FDA under an Emergency Use Authorization (EUA).  This EUA will remain in effect (meaning this test can be used) for the duration of the COVID-19 declaration under Section 564(b)(1) of the Act, 21 U.S.C. section 360bbb-3(b)(1), unless the authorization is  terminated or revoked sooner.  Performed at [facility], [place] [address] [place], [place] [address]     Labs: CBC: Recent Labs  Lab 05/21/22 2108  WBC 6.1  NEUTROABS 4.3  HGB 16.3  HCT 49.1  MCV 89.3  PLT 696   Basic Metabolic Panel: Recent Labs  Lab 05/21/22 2108 05/23/22 0640  NA 137 139  K 4.2 3.7  CL 105 105  CO2 23 25  GLUCOSE 87 103*  BUN 10 15  CREATININE 1.11 1.06  CALCIUM 9.8 9.1   Liver Function Tests: Recent Labs  Lab 05/21/22 2108 05/23/22 0640  AST 25 16  ALT 21 18  ALKPHOS 86 77  BILITOT 1.5* 0.7  PROT 8.6* 7.6  ALBUMIN 4.5 4.0   CBG: No results for input(s): "GLUCAP" in the last 168 hours.  Discharge time spent: approximately 35 minutes spent on discharge counseling, evaluation of patient on day of discharge, and coordination of discharge planning with nursing, social work, pharmacy and case management  Signed: Edwin Dada, MD [facility] 05/23/2022

## 2022-05-24 NOTE — Telephone Encounter (Signed)
LMTCB

## 2022-05-24 NOTE — Telephone Encounter (Signed)
Can we call patients and get him set up for new consult with one of the Mds. Next available for asthma.  Thank you

## 2022-06-07 ENCOUNTER — Institutional Professional Consult (permissible substitution): Payer: Medicare Other | Admitting: Pulmonary Disease

## 2022-06-10 NOTE — Telephone Encounter (Signed)
Can we please try calling him again or sending a letter to see if he wants to schedule a consult visit.

## 2022-06-10 NOTE — Telephone Encounter (Signed)
Patient has consult visit with Dr Erin Fulling scheduled for 06/21/2022. Nothing further needed at this time

## 2022-06-21 ENCOUNTER — Ambulatory Visit: Payer: Medicare Other | Admitting: Pulmonary Disease

## 2022-06-21 ENCOUNTER — Encounter: Payer: Self-pay | Admitting: Pulmonary Disease

## 2022-06-21 VITALS — BP 128/74 | HR 68 | Ht 68.0 in | Wt 275.0 lb

## 2022-06-21 DIAGNOSIS — H5319 Other subjective visual disturbances: Secondary | ICD-10-CM | POA: Diagnosis not present

## 2022-06-21 DIAGNOSIS — J4530 Mild persistent asthma, uncomplicated: Secondary | ICD-10-CM

## 2022-06-21 DIAGNOSIS — H33322 Round hole, left eye: Secondary | ICD-10-CM | POA: Diagnosis not present

## 2022-06-21 DIAGNOSIS — D331 Benign neoplasm of brain, infratentorial: Secondary | ICD-10-CM | POA: Diagnosis not present

## 2022-06-21 DIAGNOSIS — H47293 Other optic atrophy, bilateral: Secondary | ICD-10-CM | POA: Diagnosis not present

## 2022-06-21 MED ORDER — MONTELUKAST SODIUM 10 MG PO TABS: 10.0000 mg | ORAL_TABLET | Freq: Every day | ORAL | 11 refills | Status: DC

## 2022-06-21 MED ORDER — DULERA 100-5 MCG/ACT IN AERO: 2.0000 | INHALATION_SPRAY | Freq: Two times a day (BID) | RESPIRATORY_TRACT | 11 refills | Status: DC

## 2022-06-21 MED ORDER — IPRATROPIUM-ALBUTEROL 0.5-2.5 (3) MG/3ML IN SOLN: 3.0000 mL | RESPIRATORY_TRACT | 6 refills | Status: DC

## 2022-06-21 MED ORDER — ALBUTEROL SULFATE HFA 108 (90 BASE) MCG/ACT IN AERS: 2.0000 | INHALATION_SPRAY | Freq: Four times a day (QID) | RESPIRATORY_TRACT | 11 refills | Status: AC | PRN

## 2022-06-21 NOTE — Progress Notes (Signed)
Synopsis: Referred in November 2023 for asthma  Subjective:   PATIENT ID: Tommy Russell GENDER: male DOB: [birth year], MRN: [id number]   HPI  Chief Complaint  Patient presents with   Consult    Referred by ED for asthma. 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 Started having multiple flare ups over the past 2 months.    Tommy Russell is a 32 year old male, former smoker who is referred to pulmonary clinic for asthma.   He was recently admitted for asthma exacerbation 10/21 to 10/22 where he was treated with steroids and nebulizer treatments. He initially required oxygen. He was discharged with steroid taper along with dulera 100-74mg 2 puffs twice daily, zyrtec, montelukast and as needed albuterol inhaler and duoneb nebulizer treatment. He has done well since discharge.  He does have intermittent reflux symptoms and is not currently taking any medications. 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 He has issues with seasonal allergies, more so in the spring/summer time.   He quit smoking 2 months ago, he was smoking a few cigarettes per week. 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 He denies breathing issues during that time. He reports having increased infections since having brain surgery. His paternal grandmother and grandfather had lung cancer. He had second hand smoke exposure from his step father in childhood.   Past Medical History:  Diagnosis Date   Asthma    Depression    GAD (generalized anxiety disorder)      No family history on file.   Social History   Socioeconomic History   Marital status: Married    Spouse name: Not on file   Number of children: Not on file   Years of education: Not on file   Highest education level: Not on file  Occupational History   Not on file  Tobacco Use   Smoking status: Former    Packs/day: 1.00    Types: Cigarettes    Start date:  03/01/2007    Quit date: 05/02/2022    Years since quitting: 0.1   Smokeless tobacco: Never  Substance and Sexual Activity   Alcohol use: Yes    Comment: 1-2 drinks 1-2 times per week   Drug use: Never   Sexual activity: Not on file  Other Topics Concern   Not on file  Social History Narrative   Not on file   Social Determinants of Health   Financial Resource Strain: Not on file  Food Insecurity: No Food Insecurity (05/22/2022)   Hunger Vital Sign    Worried About Running Out of Food in the Last Year: Never true    Ran Out of Food in the Last Year: Never true  Recent Concern: FMontereyPresent (04/25/2022)   Hunger Vital Sign    Worried About Running Out of Food in the Last Year: Never true    RLa Feriain the Last Year: Sometimes true  Transportation Needs: No Transportation Needs (05/22/2022)   PRAPARE - THydrologist(Medical): No    Lack of Transportation (Non-Medical): No  Physical Activity: Not on file  Stress: Not on file  Social Connections: Not on file  Intimate Partner Violence: Not At Risk (05/22/2022)   Humiliation, Afraid, Rape, and Kick questionnaire    Fear of Current or Ex-Partner: No    Emotionally Abused: No  Physically Abused: No    Sexually Abused: No     No Known Allergies   Outpatient Medications Prior to Visit  Medication Sig Dispense Refill   Misc Natural Products (AIRBORNE ELDERBERRY) CHEW Chew 1-2 tablets by mouth daily.     ZYRTEC ALLERGY 10 MG tablet Take 10 mg by mouth daily.     albuterol (VENTOLIN HFA) 108 (90 Base) MCG/ACT inhaler Inhale 2 puffs into the lungs every 6 (six) hours as needed for wheezing or shortness of breath. 8 g 2   ipratropium-albuterol (DUONEB) 0.5-2.5 (3) MG/3ML SOLN Take 3 mLs by nebulization every 4 (four) hours. 360 mL 1   mometasone-formoterol (DULERA) 100-5 MCG/ACT AERO Inhale 2 puffs into the lungs in the morning and at bedtime. 1 each 1   montelukast  (SINGULAIR) 10 MG tablet Take 1 tablet (10 mg total) by mouth at bedtime. 30 tablet 3   predniSONE (DELTASONE) 10 MG tablet Take prednisone 40 mg (4 tabs) daily for 3 days then take prednisone 30 mg (3 tabs) for one day then 20 mg (2 tabs) for 1 day then 10 mg (1 tab) for one day then stop 18 tablet 0   No facility-administered medications prior to visit.    Review of Systems  Constitutional:  Negative for chills, fever, malaise/fatigue and weight loss.  HENT:  Positive for congestion. Negative for sinus pain and sore throat.   Eyes: Negative.   Respiratory:  Positive for cough, shortness of breath and wheezing. Negative for hemoptysis and sputum production.   Cardiovascular:  Negative for chest pain, palpitations, orthopnea, claudication and leg swelling.  Gastrointestinal:  Positive for heartburn. Negative for abdominal pain, nausea and vomiting.  Genitourinary: Negative.   Musculoskeletal:  Negative for joint pain and myalgias.  Skin:  Negative for rash.  Neurological:  Positive for headaches. Negative for weakness.  Endo/Heme/Allergies: Negative.   Psychiatric/Behavioral:  The patient is nervous/anxious.       Objective:   Vitals:   06/21/22 1418  BP: 128/74  Pulse: 68  SpO2: 98%  Weight: 275 lb (124.7 kg)  Height: '5\' 8"'$  (1.727 m)     Physical Exam Constitutional:      General: He is not in acute distress. HENT:     Head: Normocephalic and atraumatic.  Eyes:     Extraocular Movements: Extraocular movements intact.     Conjunctiva/sclera: Conjunctivae normal.     Pupils: Pupils are equal, round, and reactive to light.  Cardiovascular:     Rate and Rhythm: Normal rate and regular rhythm.     Pulses: Normal pulses.     Heart sounds: Normal heart sounds. No murmur heard. Pulmonary:     Effort: Pulmonary effort is normal.     Breath sounds: Normal breath sounds.  Abdominal:     General: Bowel sounds are normal.     Palpations: Abdomen is soft.  Musculoskeletal:      Right lower leg: No edema.     Left lower leg: No edema.  Lymphadenopathy:     Cervical: No cervical adenopathy.  Skin:    General: Skin is warm and dry.  Neurological:     General: No focal deficit present.     Mental Status: He is alert.  Psychiatric:        Mood and Affect: Mood normal.        Behavior: Behavior normal.        Thought Content: Thought content normal.        Judgment: Judgment normal.  CBC    Component Value Date/Time   WBC 6.1 05/21/2022 2108   RBC 5.50 05/21/2022 2108   HGB 16.3 05/21/2022 2108   HCT 49.1 05/21/2022 2108   PLT 295 05/21/2022 2108   MCV 89.3 05/21/2022 2108   MCH 29.6 05/21/2022 2108   MCHC 33.2 05/21/2022 2108   RDW 13.9 05/21/2022 2108   LYMPHSABS 1.2 05/21/2022 2108   MONOABS 0.6 05/21/2022 2108   EOSABS 0.0 05/21/2022 2108   BASOSABS 0.0 05/21/2022 2108     Chest imaging: CXR 05/21/22 The heart size and mediastinal contours are within normal limits. Both lungs are clear. The visualized skeletal structures are unremarkable.  PFT:     No data to display          Labs:  Path:  Echo:  Heart Catheterization:  Assessment & Plan:   Mild persistent asthma without complication - Plan: montelukast (SINGULAIR) 10 MG tablet, mometasone-formoterol (DULERA) 100-5 MCG/ACT AERO, ipratropium-albuterol (DUONEB) 0.5-2.5 (3) MG/3ML SOLN, albuterol (VENTOLIN HFA) 108 (90 Base) MCG/ACT inhaler  Discussion: Tommy Russell is a 31 year old male, former smoker who is referred to pulmonary clinic for asthma.   He has mild persistent ashtma. He is to continue dulera 100-57mg 2 puffs twice daily and as needed albuterol/duonebs.  He is to continue zyrtec and montelukast for allergies.   We will check pulmonary function tests.  Follow up in 2 months with PFTs.   JFreda Jackson MD [facility] Office: [phone]  Current Outpatient Medications:    Misc Natural Products (AIRBORNE ELDERBERRY)  CHEW, Chew 1-2 tablets by mouth daily., Disp: , Rfl:    ZYRTEC ALLERGY 10 MG tablet, Take 10 mg by mouth daily., Disp: , Rfl:    albuterol (VENTOLIN HFA) 108 (90 Base) MCG/ACT inhaler, Inhale 2 puffs into the lungs every 6 (six) hours as needed for wheezing or shortness of breath., Disp: 8 g, Rfl: 11   ipratropium-albuterol (DUONEB) 0.5-2.5 (3) MG/3ML SOLN, Take 3 mLs by nebulization every 4 (four) hours., Disp: 360 mL, Rfl: 6   mometasone-formoterol (DULERA) 100-5 MCG/ACT AERO, Inhale 2 puffs into the lungs in the morning and at bedtime., Disp: 1 each, Rfl: 11   montelukast (SINGULAIR) 10 MG tablet, Take 1 tablet (10 mg total) by mouth at bedtime., Disp: 30 tablet, Rfl: 11

## 2022-06-21 NOTE — Patient Instructions (Addendum)
Continue dulera 2 puffs twice daily - rinse mouth out after each use  Continue to use albuterol inhaler 1-2 puffs every 4-6 hours as needed  Continue to use duoneb nebulizer treatments as needed, every 4-6 hours  Continue montelukast daily  Contiue zyrtec daily  Follow up in 2-3 months with pulmonary function tests

## 2022-06-28 ENCOUNTER — Institutional Professional Consult (permissible substitution): Payer: Medicare Other | Admitting: Pulmonary Disease

## 2022-08-03 ENCOUNTER — Telehealth: Payer: Self-pay | Admitting: Pulmonary Disease

## 2022-08-03 DIAGNOSIS — J4531 Mild persistent asthma with (acute) exacerbation: Secondary | ICD-10-CM

## 2022-08-03 MED ORDER — PREDNISONE 10 MG PO TABS: ORAL_TABLET | ORAL | 0 refills | Status: AC

## 2022-08-03 MED ORDER — AZITHROMYCIN 250 MG PO TABS: ORAL_TABLET | ORAL | 0 refills | Status: DC

## 2022-08-03 NOTE — Telephone Encounter (Signed)
PT calling for RX of Pred and Antibx. States Dr. Erin Fulling said he could call us until he can get a primary care provider.   Dr. Erin Fulling saw him recently.  Pharm is [facility] on [address].

## 2022-08-03 NOTE — Telephone Encounter (Signed)
Called patient and he states that he is having a cough, with mucus production, tightness in chest and mucus is thick in the morning and makes him have more issues in the morning with his breathing. Kids are all sick and its making his asthma worse. No fever. When patient coughs up mucus he states it is white and a few times his wife states that it is green sometimes. He is asking for prednisone and antibiotics if possible.   Please advise sir

## 2022-08-03 NOTE — Telephone Encounter (Signed)
Called and spoke to patient and let him know that Dr Erin Fulling sent in zpak and prednisone for him. Advised him to take these medications with food so he does not get nauseous. He verbalized understanding. Nothing further needed

## 2022-08-03 NOTE — Telephone Encounter (Signed)
Steroid taper and Zpak sent to his pharmacy.   Thanks, JD

## 2022-08-26 ENCOUNTER — Telehealth: Payer: Self-pay | Admitting: Pulmonary Disease

## 2022-08-26 NOTE — Telephone Encounter (Signed)
Pt calling for some Pred and Antibx to be called in. He has a cough again and slight SOB a few times a day. Congestion. Started 2 days ago. Son got sick again and made him sick again. Also states headache and body aches.  Pls call to advise [phone]  No Covid   Pharm: [facility] on [address]

## 2022-08-26 NOTE — Telephone Encounter (Signed)
Called and spoke with pt who states he has had complaints including cough with clear phelgm, congestion, headache, body aches. Pt said he took a covid test 1/24 which was negative.  Stated to pt that he really needed to be evaluated as his symptoms are classic flu/covid symptoms. Pt is unable to get in to see PCP this week and also stated to pt that we had no openings at our office so recommended that he needed to go to UC for evaluation and he verbalized understanding. Nothing further needed.

## 2022-09-01 ENCOUNTER — Encounter: Payer: Self-pay | Admitting: Internal Medicine

## 2022-09-06 ENCOUNTER — Ambulatory Visit: Payer: Medicare Other | Admitting: Pulmonary Disease

## 2022-09-06 DIAGNOSIS — H33322 Round hole, left eye: Secondary | ICD-10-CM | POA: Diagnosis not present

## 2022-09-06 DIAGNOSIS — H4713 Papilledema associated with retinal disorder: Secondary | ICD-10-CM | POA: Diagnosis not present

## 2022-09-06 DIAGNOSIS — Z8669 Personal history of other diseases of the nervous system and sense organs: Secondary | ICD-10-CM | POA: Diagnosis not present

## 2022-09-06 DIAGNOSIS — H47293 Other optic atrophy, bilateral: Secondary | ICD-10-CM | POA: Diagnosis not present

## 2022-09-06 DIAGNOSIS — H5319 Other subjective visual disturbances: Secondary | ICD-10-CM | POA: Diagnosis not present

## 2022-09-27 ENCOUNTER — Other Ambulatory Visit: Payer: Self-pay | Admitting: Radiation Therapy

## 2022-09-29 ENCOUNTER — Encounter: Payer: Self-pay | Admitting: Nurse Practitioner

## 2022-09-29 ENCOUNTER — Ambulatory Visit (INDEPENDENT_AMBULATORY_CARE_PROVIDER_SITE_OTHER): Payer: Medicare Other | Admitting: Nurse Practitioner

## 2022-09-29 VITALS — BP 126/74 | HR 94 | Ht 68.0 in | Wt 276.5 lb

## 2022-09-29 DIAGNOSIS — G4719 Other hypersomnia: Secondary | ICD-10-CM | POA: Diagnosis not present

## 2022-09-29 DIAGNOSIS — Z6841 Body Mass Index (BMI) 40.0 and over, adult: Secondary | ICD-10-CM

## 2022-09-29 DIAGNOSIS — R0683 Snoring: Secondary | ICD-10-CM | POA: Diagnosis not present

## 2022-09-29 DIAGNOSIS — J4541 Moderate persistent asthma with (acute) exacerbation: Secondary | ICD-10-CM

## 2022-09-29 MED ORDER — PREDNISONE 10 MG PO TABS: ORAL_TABLET | ORAL | 0 refills | Status: DC

## 2022-09-29 NOTE — Patient Instructions (Addendum)
Continue Albuterol inhaler 2 puffs or duoneb 3 mL neb every 6 hours as needed for shortness of breath or wheezing. Notify if symptoms persist despite rescue inhaler/neb use. Increase Dulera to 2 puffs Twice daily. Brush tongue and rinse mouth afterwards Continue montelukast (singulair) 10 mg At bedtime  Continue zyrtec 1 tab daily  Prednisone taper. 4 tabs for 2 days, then 3 tabs for 2 days, 2 tabs for 2 days, then 1 tab for 2 days, then stop. Take in AM with food  Asthma action plan: START nebs up to four times a day for worsening shortness of breath, wheezing and cough. If you symptoms do not improve in 24-48 hours, contact us for steroid course. If your symptoms rapidly worsen, you have trouble talking or extreme difficulty breathing, or you're not getting relief from your nebulizer/rescue, go to the emergency department.  Labs today   Given your symptoms, I am concerned that you may have sleep disordered breathing with sleep apnea. You will need a sleep study for further evaluation. Someone will contact you for further evaluation.  We discussed how untreated sleep apnea puts an individual at risk for cardiac arrhthymias, pulm HTN, DM, stroke and increases their risk for daytime accidents. We also briefly reviewed treatment options including weight loss, side sleeping position, oral appliance, CPAP therapy or referral to ENT for possible surgical options  Follow up as scheduled with Dr. Erin Fulling on 3/20. If symptoms do not improve or worsen, please contact office for sooner follow up or seek emergency care.

## 2022-09-29 NOTE — Progress Notes (Unsigned)
$'@Patient'g$  ID: Tommy Russell, male    DOB: June 25, 1991, 32 y.o.   MRN: [id number]  Chief Complaint  Patient presents with   Consult    Pt consult for sleep he reports he has been told he snores, morning headaches, dry mouth. Denies being told he ha apneas    Referring provider: No ref. provider found  HPI: 32 year old male, former smoker followed for asthma.  He is a patient of Dr. August Albino and last seen in office 06/21/2022.  Past medical history significant for glioneuronal tumor, legal blindness, depression, anxiety, obesity.  TEST/EVENTS:   06/21/2022: OV with Dr. Erin Fulling.  Recently admitted for asthma exacerbation 10/21-10/22 where he was treated with steroids nebulizer treatments.  Initially did require oxygen but was discharged on room air.  He was provided with [facility] twice daily, Zyrtec and Singulair.  Doing well since discharge.  Does have intermittent reflux symptoms is not currently taking any medications.  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  Quit smoking around 2 months ago.  Was smoking a few cigarettes per week.  Worked in [employer] for a year.  Did not wear a respirator at the time.  Did not have any breathing issues at that time.  Has had increased infections since having brain surgery.  Paternal grandmother and grandfather had lung cancer.  Did have secondhand smoke exposure from his stepfather during childhood.  No change to current regimen.  Plan for PFTs.  Follow-up 2 months.  09/29/2022: Today - follow up Patient presents today for sleep evaluation.  When he had been admitted to the hospital back in October, he was recommended to have sleep study completed.  His mother is with him today and helps provide some of his history.  He has snored for many years and has been told that he stops breathing when he sleeps at night.  He does occasionally wake up with morning headaches.  Always has morning dry mouth.   He has daytime fatigue and will fall asleep if he is sitting around on the couch.  He denies any sleep parasomnia/paralysis.  No symptoms of narcolepsy or cataplexy.  He does not drive as he is legally blind.  He is able to see certain things very close up and tell whether it is day or night. He goes to bed between 12 AM to 1 AM.  Falls asleep within 20 to 30 minutes.  Wakes 2-3 times a day.  Usually gets up sometime between 7 AM to 8:30 AM.  No sleep aids.  No difficulties falling asleep.  He has had a 75 pound weight gain since 2021.  Never had a previous sleep study. Past history of asthma, allergies and chronic headaches.  No history of stroke.  Quit smoking in November 2023.  Rare alcohol intake.  Lives at home with his spouse and 3 kids.  On disability. Family history of allergies, asthma and lung cancer.  They also tell me that over the past week, he has been having some more trouble with his breathing.  He initially developed some URI symptoms with nasal congestion and drainage.  His wife was also sick at the time. He then started noticing increased wheezing, cough and shortness of breath.  He does feel like the shortness of breath and wheezing have gotten better but he still has a lingering cough, non-productive.  Denies any fevers, chills, hemoptysis, lower extremity swelling.  Did not complete any viral testing.  He is only been using his Dulera once a day.  He did use his rescue few times last week but has not used it since.  Still taking Singulair at night.  Scheduled for pulmonary function testing next month.  Epworth 11  No Known Allergies  Immunization History  Administered Date(s) Administered   PNEUMOCOCCAL CONJUGATE-20 05/23/2022    Past Medical History:  Diagnosis Date   Asthma    Depression    GAD (generalized anxiety disorder)     Tobacco History: Social History   Tobacco Use  Smoking Status Former   Packs/day: 1.00   Types: Cigarettes   Start date: 03/01/2007    Quit date: 05/02/2022   Years since quitting: 0.4  Smokeless Tobacco Never   Counseling given: Not Answered   Outpatient Medications Prior to Visit  Medication Sig Dispense Refill   albuterol (VENTOLIN HFA) 108 (90 Base) MCG/ACT inhaler Inhale 2 puffs into the lungs every 6 (six) hours as needed for wheezing or shortness of breath. 8 g 11   ipratropium-albuterol (DUONEB) 0.5-2.5 (3) MG/3ML SOLN Take 3 mLs by nebulization every 4 (four) hours. 360 mL 6   Misc Natural Products (AIRBORNE ELDERBERRY) CHEW Chew 1-2 tablets by mouth daily.     mometasone-formoterol (DULERA) 100-5 MCG/ACT AERO Inhale 2 puffs into the lungs in the morning and at bedtime. 1 each 11   montelukast (SINGULAIR) 10 MG tablet Take 1 tablet (10 mg total) by mouth at bedtime. 30 tablet 11   ZYRTEC ALLERGY 10 MG tablet Take 10 mg by mouth daily.     pantoprazole (PROTONIX) 20 MG tablet Take 20 mg by mouth daily. (Patient not taking: Reported on 09/29/2022)     azithromycin (ZITHROMAX) 250 MG tablet Take as directed 6 tablet 0   No facility-administered medications prior to visit.     Review of Systems:   Constitutional: No weight loss or gain, night sweats, fevers, chills, or lassitude. +daytime fatigue  HEENT: No headaches, difficulty swallowing, tooth/dental problems, or sore throat. No sneezing, itching, ear ache, nasal congestion (resolved), or post nasal drip CV:  No chest pain, orthopnea, PND, swelling in lower extremities, anasarca, dizziness, palpitations, syncope Resp: +shortness of breath with exertion (improved); wheeze; non-productive cough; snoring; witnessed apneas. No excess mucus or change in color of mucus. No hemoptysis.  No chest wall deformity GI:  No heartburn, indigestion, abdominal pain, nausea, vomiting, diarrhea, change in bowel habits, loss of appetite GU: No dysuria, change in color of urine, urgency or frequency.   Skin: No rash, lesions, ulcerations MSK:  No joint pain or swelling.   Neuro:  No dizziness or lightheadedness.  Psych: No depression. +anxiety (stable). Mood stable.     Physical Exam:  BP 126/74   Pulse 94   Ht '5\' 8"'$  (1.727 m)   Wt 276 lb 8 oz (125.4 kg)   SpO2 97%   BMI 42.04 kg/m   GEN: Pleasant, interactive, well-kempt; obese; in no acute distress. HEENT:  Normocephalic and atraumatic. Sclera white. Nasal turbinates pink, moist and patent bilaterally. No rhinorrhea present. Oropharynx pink and moist, without exudate or edema. No lesions, ulcerations, or postnasal drip. Mallampati III NECK:  Supple w/ fair ROM. No JVD present. Normal carotid impulses w/o bruits. Thyroid symmetrical with no goiter or nodules palpated. No lymphadenopathy.   CV: RRR, no m/r/g, no peripheral edema. Pulses intact, +2 bilaterally. No cyanosis, pallor or clubbing. PULMONARY:  Unlabored, regular breathing. Expiratory wheezes bilaterally A&P. No accessory muscle use.  GI: BS present  and normoactive. Soft, non-tender to palpation. No organomegaly or masses detected.  MSK: No erythema, warmth or tenderness. Cap refil <2 sec all extrem. No deformities or joint swelling noted.  Neuro: A/Ox3. No focal deficits noted.   Skin: Warm, no lesions or rashe Psych: Normal affect and behavior. Judgement and thought content appropriate.     Lab Results:  CBC    Component Value Date/Time   WBC 5.8 09/29/2022 1648   RBC 4.99 09/29/2022 1648   HGB 15.3 09/29/2022 1648   HCT 45.1 09/29/2022 1648   PLT 266.0 09/29/2022 1648   MCV 90.3 09/29/2022 1648   MCH 29.6 05/21/2022 2108   MCHC 33.9 09/29/2022 1648   RDW 14.4 09/29/2022 1648   LYMPHSABS 1.5 09/29/2022 1648   MONOABS 0.6 09/29/2022 1648   EOSABS 0.0 09/29/2022 1648   BASOSABS 0.0 09/29/2022 1648    BMET    Component Value Date/Time   NA 139 05/23/2022 0640   K 3.7 05/23/2022 0640   CL 105 05/23/2022 0640   CO2 25 05/23/2022 0640   GLUCOSE 103 (H) 05/23/2022 0640   BUN 15 05/23/2022 0640   CREATININE 1.06 05/23/2022 0640    CALCIUM 9.1 05/23/2022 0640   GFRNONAA >60 05/23/2022 0640   GFRAA >60 12/01/2019 0226    BNP    Component Value Date/Time   BNP 5.7 04/24/2022 1554     Imaging:  No results found.        No data to display          No results found for: "NITRICOXIDE"      Assessment & Plan:   Acute asthma exacerbation Slowly resolving asthma exacerbation secondary to URI with persistent bronchospasm and cough. We will treat him with prednisone taper and cough control measures. Hold on imaging for now as symptoms are gradually improving. Advised him to restart his Dulera twice daily. Reviewed the role/importance of a maintenance inhaler in the treatment of asthma. Verbalized understanding. This is is third exacerbation since October. May consider biologic therapy in the future, if he remains poorly controlled. Recheck eosinophils today and IgE. Asthma action plan in place. Attend PFTs as scheduled.  Patient Instructions  Continue Albuterol inhaler 2 puffs or duoneb 3 mL neb every 6 hours as needed for shortness of breath or wheezing. Notify if symptoms persist despite rescue inhaler/neb use. Increase Dulera to 2 puffs Twice daily. Brush tongue and rinse mouth afterwards Continue montelukast (singulair) 10 mg At bedtime  Continue zyrtec 1 tab daily  Prednisone taper. 4 tabs for 2 days, then 3 tabs for 2 days, 2 tabs for 2 days, then 1 tab for 2 days, then stop. Take in AM with food  Asthma action plan: START nebs up to four times a day for worsening shortness of breath, wheezing and cough. If you symptoms do not improve in 24-48 hours, contact us for steroid course. If your symptoms rapidly worsen, you have trouble talking or extreme difficulty breathing, or you're not getting relief from your nebulizer/rescue, go to the emergency department.  Labs today   Given your symptoms, I am concerned that you may have sleep disordered breathing with sleep apnea. You will need a sleep study for  further evaluation. Someone will contact you for further evaluation.  We discussed how untreated sleep apnea puts an individual at risk for cardiac arrhthymias, pulm HTN, DM, stroke and increases their risk for daytime accidents. We also briefly reviewed treatment options including weight loss, side sleeping position, oral appliance, CPAP  therapy or referral to ENT for possible surgical options  Follow up as scheduled with Dr. Erin Fulling on 3/20. If symptoms do not improve or worsen, please contact office for sooner follow up or seek emergency care.    Excessive daytime sleepiness Based on his symptoms, he does not seem to have trouble with insomnia or circadian rhythm disorder, which was considered due to him being legally blind. He does have snoring, excessive daytime sleepiness, nocturnal apneic events, morning headaches. BMI 42. Epworth 11. Given this,  I am concerned he could have sleep disordered breathing with obstructive sleep apnea. He will need sleep study for further evaluation.    - discussed how weight can impact sleep and risk for sleep disordered breathing - discussed options to assist with weight loss: combination of diet modification, cardiovascular and strength training exercises   - had an extensive discussion regarding the adverse health consequences related to untreated sleep disordered breathing - specifically discussed the risks for hypertension, coronary artery disease, cardiac dysrhythmias, cerebrovascular disease, and diabetes - lifestyle modification discussed   - discussed how sleep disruption can increase risk of accidents, particularly when driving - safe driving practices were discussed   Loud snoring See above  Class 3 severe obesity with body mass index (BMI) of 40.0 to 44.9 in adult ([place]) BMI 42. Healthy weight loss encouraged.    I spent 42 minutes of dedicated to the care of this patient on the date of this encounter to include pre-visit review of records,  face-to-face time with the patient discussing conditions above, post visit ordering of testing, clinical documentation with the electronic health record, making appropriate referrals as documented, and communicating necessary findings to members of the patients care team.  Clayton Bibles, NP 09/30/2022  Pt aware and understands NP's role.

## 2022-09-30 ENCOUNTER — Encounter: Payer: Self-pay | Admitting: Nurse Practitioner

## 2022-09-30 DIAGNOSIS — R0683 Snoring: Secondary | ICD-10-CM | POA: Insufficient documentation

## 2022-09-30 DIAGNOSIS — G4719 Other hypersomnia: Secondary | ICD-10-CM | POA: Insufficient documentation

## 2022-09-30 LAB — CBC WITH DIFFERENTIAL/PLATELET
Basophils Absolute: 0 10*3/uL (ref 0.0–0.1)
Basophils Relative: 0.5 % (ref 0.0–3.0)
Eosinophils Absolute: 0 10*3/uL (ref 0.0–0.7)
Eosinophils Relative: 0.4 % (ref 0.0–5.0)
HCT: 45.1 % (ref 39.0–52.0)
Hemoglobin: 15.3 g/dL (ref 13.0–17.0)
Lymphocytes Relative: 26.8 % (ref 12.0–46.0)
Lymphs Abs: 1.5 10*3/uL (ref 0.7–4.0)
MCHC: 33.9 g/dL (ref 30.0–36.0)
MCV: 90.3 fl (ref 78.0–100.0)
Monocytes Absolute: 0.6 10*3/uL (ref 0.1–1.0)
Monocytes Relative: 9.7 % (ref 3.0–12.0)
Neutro Abs: 3.6 10*3/uL (ref 1.4–7.7)
Neutrophils Relative %: 62.6 % (ref 43.0–77.0)
Platelets: 266 10*3/uL (ref 150.0–400.0)
RBC: 4.99 Mil/uL (ref 4.22–5.81)
RDW: 14.4 % (ref 11.5–15.5)
WBC: 5.8 10*3/uL (ref 4.0–10.5)

## 2022-09-30 LAB — IGE: IgE (Immunoglobulin E), Serum: 975 kU/L — ABNORMAL HIGH (ref <=114)

## 2022-09-30 NOTE — Assessment & Plan Note (Signed)
BMI 42. Healthy weight loss encouraged.

## 2022-09-30 NOTE — Assessment & Plan Note (Addendum)
Slowly resolving asthma exacerbation secondary to URI with persistent bronchospasm and cough. We will treat him with prednisone taper and cough control measures. Hold on imaging for now as symptoms are gradually improving. Advised him to restart his Dulera twice daily. Reviewed the role/importance of a maintenance inhaler in the treatment of asthma. Verbalized understanding. This is is third exacerbation since October. May consider biologic therapy in the future, if he remains poorly controlled. Recheck eosinophils today and IgE. Asthma action plan in place. Attend PFTs as scheduled.  Patient Instructions  Continue Albuterol inhaler 2 puffs or duoneb 3 mL neb every 6 hours as needed for shortness of breath or wheezing. Notify if symptoms persist despite rescue inhaler/neb use. Increase Dulera to 2 puffs Twice daily. Brush tongue and rinse mouth afterwards Continue montelukast (singulair) 10 mg At bedtime  Continue zyrtec 1 tab daily  Prednisone taper. 4 tabs for 2 days, then 3 tabs for 2 days, 2 tabs for 2 days, then 1 tab for 2 days, then stop. Take in AM with food  Asthma action plan: START nebs up to four times a day for worsening shortness of breath, wheezing and cough. If you symptoms do not improve in 24-48 hours, contact us for steroid course. If your symptoms rapidly worsen, you have trouble talking or extreme difficulty breathing, or you're not getting relief from your nebulizer/rescue, go to the emergency department.  Labs today   Given your symptoms, I am concerned that you may have sleep disordered breathing with sleep apnea. You will need a sleep study for further evaluation. Someone will contact you for further evaluation.  We discussed how untreated sleep apnea puts an individual at risk for cardiac arrhthymias, pulm HTN, DM, stroke and increases their risk for daytime accidents. We also briefly reviewed treatment options including weight loss, side sleeping position, oral appliance,  CPAP therapy or referral to ENT for possible surgical options  Follow up as scheduled with Dr. Erin Fulling on 3/20. If symptoms do not improve or worsen, please contact office for sooner follow up or seek emergency care.

## 2022-09-30 NOTE — Assessment & Plan Note (Signed)
Based on his symptoms, he does not seem to have trouble with insomnia or circadian rhythm disorder, which was considered due to him being legally blind. He does have snoring, excessive daytime sleepiness, nocturnal apneic events, morning headaches. BMI 42. Epworth 11. Given this,  I am concerned he could have sleep disordered breathing with obstructive sleep apnea. He will need sleep study for further evaluation.    - discussed how weight can impact sleep and risk for sleep disordered breathing - discussed options to assist with weight loss: combination of diet modification, cardiovascular and strength training exercises   - had an extensive discussion regarding the adverse health consequences related to untreated sleep disordered breathing - specifically discussed the risks for hypertension, coronary artery disease, cardiac dysrhythmias, cerebrovascular disease, and diabetes - lifestyle modification discussed   - discussed how sleep disruption can increase risk of accidents, particularly when driving - safe driving practices were discussed

## 2022-09-30 NOTE — Assessment & Plan Note (Signed)
See above

## 2022-10-06 ENCOUNTER — Ambulatory Visit (HOSPITAL_COMMUNITY)
Admission: RE | Admit: 2022-10-06 | Discharge: 2022-10-06 | Disposition: A | Discharge status: Home / Self Care | Payer: Medicare Other | Source: Ambulatory Visit | Attending: Internal Medicine | Admitting: Internal Medicine

## 2022-10-06 DIAGNOSIS — D497 Neoplasm of unspecified behavior of endocrine glands and other parts of nervous system: Secondary | ICD-10-CM

## 2022-10-06 DIAGNOSIS — Z982 Presence of cerebrospinal fluid drainage device: Secondary | ICD-10-CM | POA: Diagnosis not present

## 2022-10-06 MED ORDER — GADOBUTROL 1 MMOL/ML IV SOLN
10.0000 mL | Freq: Once | INTRAVENOUS | Status: AC | PRN
  Administered 2022-10-06: 10 mL via INTRAVENOUS

## 2022-10-06 NOTE — Progress Notes (Signed)
IgE significantly elevated indicating an allergic component to his asthma. May consider referral to allergist in the future but we will monitor how he's doing and decide at follow up. Thanks.

## 2022-10-07 ENCOUNTER — Other Ambulatory Visit (HOSPITAL_COMMUNITY): Payer: Medicaid Other

## 2022-10-11 ENCOUNTER — Inpatient Hospital Stay: Payer: Medicare Other | Attending: Internal Medicine | Admitting: Internal Medicine

## 2022-10-11 ENCOUNTER — Other Ambulatory Visit: Payer: Self-pay

## 2022-10-11 ENCOUNTER — Inpatient Hospital Stay: Payer: Medicare Other

## 2022-10-11 VITALS — BP 133/83 | HR 79 | Temp 98.4°F | Resp 16 | Wt 276.0 lb

## 2022-10-11 DIAGNOSIS — D432 Neoplasm of uncertain behavior of brain, unspecified: Secondary | ICD-10-CM | POA: Diagnosis not present

## 2022-10-11 DIAGNOSIS — D497 Neoplasm of unspecified behavior of endocrine glands and other parts of nervous system: Secondary | ICD-10-CM | POA: Diagnosis not present

## 2022-10-11 DIAGNOSIS — Z87891 Personal history of nicotine dependence: Secondary | ICD-10-CM | POA: Insufficient documentation

## 2022-10-11 NOTE — Progress Notes (Signed)
[facility] [place], [place] [address] [phone]   Interval Evaluation  Date of Service: 10/11/22 Patient Name: Tommy Russell Patient MRN: [id number] Patient DOB: May 24, 1991 Provider: Ventura Sellers, MD  Identifying Statement:  Tommy Russell is a 32 y.o. 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 Christella Noa and Dr. Marcello Moores  Biomarkers:  MGMT Unknown.  IDH 1/2 Unknown.  EGFR Unknown  TERT Unknown   Interval History:  Tommy Russell presents today for follow up after recent MRI brain. Continues to experience dense visual impairment, continues to follow with ophthalmologist at [facility].  No seizures over the past year.  No other new or progressive changes.  H+P (12/28/19) Patient presented to medical attention in April 2021 after several weeks of progressive blurry vision.  Several days before presentation, he began to experience severe morning headaches, different from typical tension headaches.  In addition, his walking and balance became somewhat impaired, although he never required any assist or had any falls.  CNS imaging demonstrated large cystic mass in the posterior fossa with accompanying hydrocephalus.  Mass was resected on 11/29/19 and accompanied by EVD placement.  Drain was removed after several days, and patient improved clinically except for visual impairment.  At this time he feels back to normal physically, vision is still very blurry.    Medications: Current Outpatient Medications on File Prior to Visit  Medication Sig Dispense Refill   albuterol (VENTOLIN HFA) 108 (90 Base) MCG/ACT inhaler Inhale 2 puffs into the lungs every 6 (six) hours as needed for wheezing or shortness of breath. 8 g 11   ipratropium-albuterol (DUONEB) 0.5-2.5 (3) MG/3ML SOLN Take 3 mLs by nebulization every 4 (four) hours. 360 mL 6   Misc Natural Products  (AIRBORNE ELDERBERRY) CHEW Chew 1-2 tablets by mouth daily.     mometasone-formoterol (DULERA) 100-5 MCG/ACT AERO Inhale 2 puffs into the lungs in the morning and at bedtime. 1 each 11   montelukast (SINGULAIR) 10 MG tablet Take 1 tablet (10 mg total) by mouth at bedtime. 30 tablet 11   pantoprazole (PROTONIX) 20 MG tablet Take 20 mg by mouth daily. (Patient not taking: Reported on 09/29/2022)     predniSONE (DELTASONE) 10 MG tablet 4 tabs for 2 days, then 3 tabs for 2 days, 2 tabs for 2 days, then 1 tab for 2 days, then stop 20 tablet 0   ZYRTEC ALLERGY 10 MG tablet Take 10 mg by mouth daily.     No current facility-administered medications on file prior to visit.    Allergies: No Known Allergies Past Medical History:  Past Medical History:  Diagnosis Date   Asthma    Depression    GAD (generalized anxiety disorder)    Past Surgical History:  Past Surgical History:  Procedure Laterality Date   SUBOCCIPITAL CRANIECTOMY CERVICAL LAMINECTOMY N/A 11/29/2019   Procedure: SUBOCCIPITAL CRANIECTOMY CERVICAL LAMINECTOMY/DURAPLASTY FOR RESECTION OF MASSS AND PLACEMENT OF EXTERNAL VENTRICULAR DRAIN ;  Surgeon: Vallarie Mare, MD;  Location: [facility];  Service: Neurosurgery;  Laterality: N/A;   Social History:  Social History   Socioeconomic History   Marital status: Married    Spouse name: Not on file   Number of children: Not on file   Years of education: Not on file   Highest education level: Not on file  Occupational History   Not on file  Tobacco Use  Smoking status: Former    Packs/day: 1.00    Types: Cigarettes    Start date: 03/01/2007    Quit date: 05/02/2022    Years since quitting: 0.4   Smokeless tobacco: Never  Substance and Sexual Activity   Alcohol use: Yes    Comment: 1-2 drinks 1-2 times per week   Drug use: Never   Sexual activity: Not on file  Other Topics Concern   Not on file  Social History Narrative   Not on file   Social Determinants of Health    Financial Resource Strain: Not on file  Food Insecurity: No Food Insecurity (05/22/2022)   Hunger Vital Sign    Worried About Running Out of Food in the Last Year: Never true    Ran Out of Food in the Last Year: Never true  Recent Concern: Food Insecurity - Food Insecurity Present (04/25/2022)   Hunger Vital Sign    Worried About Running Out of Food in the Last Year: Never true    Ran Out of Food in the Last Year: Sometimes true  Transportation Needs: No Transportation Needs (05/22/2022)   PRAPARE - Hydrologist (Medical): No    Lack of Transportation (Non-Medical): No  Physical Activity: Not on file  Stress: Not on file  Social Connections: Not on file  Intimate Partner Violence: Not At Risk (05/22/2022)   Humiliation, Afraid, Rape, and Kick questionnaire    Fear of Current or Ex-Partner: No    Emotionally Abused: No    Physically Abused: No    Sexually Abused: No   Family History: No family history on file.  Review of Systems: Constitutional: Doesn't report fevers, chills or abnormal weight loss Eyes: Doesn't report blurriness of vision Ears, nose, mouth, throat, and face: Doesn't report sore throat Respiratory: Doesn't report cough, dyspnea or wheezes Cardiovascular: Doesn't report palpitation, chest discomfort  Gastrointestinal:  Doesn't report nausea, constipation, diarrhea GU: Doesn't report incontinence Skin: Doesn't report skin rashes Neurological: Per HPI Musculoskeletal: Doesn't report joint pain Behavioral/Psych: Doesn't report anxiety  Physical Exam: Vitals:   10/11/22 1014  BP: 133/83  Pulse: 79  Resp: 16  Temp: 98.4 F (36.9 C)  SpO2: 97%    KPS: 80. General: Alert, cooperative, pleasant, in no acute distress Head: Normal EENT: No conjunctival injection or scleral icterus.  Lungs: Resp effort normal Cardiac: Regular rate Abdomen: Non-distended abdomen Skin: No rashes cyanosis or petechiae. Extremities: No clubbing  or edema  Neurologic Exam: Mental Status: Awake, alert, attentive to examiner. Oriented to self and environment. Language is fluent with intact comprehension.  Cranial Nerves: Visual acuity is impaired in both eyes. Visual fields are full. Extra-ocular movements intact. No ptosis. Face is symmetric Motor: Tone and bulk are normal. Power is full in both arms and legs. Reflexes are symmetric, no pathologic reflexes present.  Sensory: Intact to light touch Gait: Independent, impaired tandem.   Labs: I have reviewed the data as listed    Component Value Date/Time   NA 139 05/23/2022 0640   K 3.7 05/23/2022 0640   CL 105 05/23/2022 0640   CO2 25 05/23/2022 0640   GLUCOSE 103 (H) 05/23/2022 0640   BUN 15 05/23/2022 0640   CREATININE 1.06 05/23/2022 0640   CALCIUM 9.1 05/23/2022 0640   PROT 7.6 05/23/2022 0640   ALBUMIN 4.0 05/23/2022 0640   AST 16 05/23/2022 0640   ALT 18 05/23/2022 0640   ALKPHOS 77 05/23/2022 0640   BILITOT 0.7 05/23/2022 0640  GFRNONAA >60 05/23/2022 0640   GFRAA >60 12/01/2019 0226   Lab Results  Component Value Date   WBC 5.8 09/29/2022   NEUTROABS 3.6 09/29/2022   HGB 15.3 09/29/2022   HCT 45.1 09/29/2022   MCV 90.3 09/29/2022   PLT 266.0 09/29/2022   Imaging:  [facility] Clinician Interpretation: I have personally reviewed the CNS images as listed.  My interpretation, in the context of the patient's clinical presentation, is stable disease  MR BRAIN W WO CONTRAST  Result Date: 10/07/2022 CLINICAL DATA:  Brain/CNS neoplasm.  Assess treatment response. EXAM: MRI HEAD WITHOUT AND WITH CONTRAST TECHNIQUE: Multiplanar, multiecho pulse sequences of the brain and surrounding structures were obtained without and with intravenous contrast. CONTRAST:  103m GADAVIST GADOBUTROL 1 MMOL/ML IV SOLN COMPARISON:  MRI brain 09/30/2021 and older. FINDINGS: Brain: Postoperative changes of suboccipital craniotomy and resection of a midline cerebellar mass. No new or nodular  enhancement to suggest local recurrence unchanged minimal amount of surrounding T2 hyperintensity. No acute infarct or hemorrhage. No mass effect or midline shift. Prior right posterior approach ventriculostomy catheter tract. No extra-axial collection. Vascular: Normal flow voids and enhancement. Skull and upper cervical spine: Prior suboccipital craniotomy. Sinuses/Orbits: Mild mucosal disease in the left maxillary sinus and bilateral ethmoid air cells. Orbits are unremarkable. Other: None. IMPRESSION: Stable postoperative changes without evidence of disease recurrence. Electronically Signed   By: WEmmit AlexandersM.D.   On: 10/07/2022 14:14     Assessment/Plan Glioneuronal tumor [D49.7]  We appreciate the opportunity to participate in the care of Tommy Russell  He is clinically and radiographically stable today from oncologic standpoint.  Ok to con't Tommy Russell '50mg'$  HS as headache prophylaxis.  We ask that MPrintes Lutskyreturn to clinic in 12 months following next brain MRI, or sooner as needed.    All questions were answered. The patient knows to call the clinic with any problems, questions or concerns. No barriers to learning were detected.  I have spent a total of 30 minutes of face-to-face and non-face-to-face time, excluding clinical staff time, preparing to see patient, ordering tests and/or medications, counseling the patient, and independently interpreting results and communicating results to the patient/family/caregiver   ZVentura Sellers MD Medical Director of Neuro-Oncology [facility]at [place]03/11/24 9:55 AM

## 2022-10-20 ENCOUNTER — Ambulatory Visit: Payer: Medicare Other | Admitting: Pulmonary Disease

## 2022-10-20 NOTE — Progress Notes (Deleted)
Synopsis: Referred in November 2023 for asthma  Subjective:   PATIENT ID: Tommy Russell GENDER: male DOB: Aug 14, 1990, MRN: [id number]   HPI  No chief complaint on file.  Tommy Russell is a 32 year old male, former smoker who returns to pulmonary clinic for asthma.   He was seen by Roxan Diesel, NP 09/29/22 for asthma exacerbation. IgE level was elevated at 975. He was treated with steroid taper.   He was also evaluated for sleep apnea at that visit and has been ordered for home sleep study.  Initial OV 06/21/22 He was recently admitted for asthma exacerbation 10/21 to 10/22 where he was treated with steroids and nebulizer treatments. He initially required oxygen. He was discharged with steroid taper along with dulera 100-70mcg 2 puffs twice daily, zyrtec, montelukast and as needed albuterol inhaler and duoneb nebulizer treatment. He has done well since discharge.  He does have intermittent reflux symptoms and is not currently taking any medications. 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 He has issues with seasonal allergies, more so in the spring/summer time.   He quit smoking 2 months ago, he was smoking a few cigarettes per week. 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 He denies breathing issues during that time. He reports having increased infections since having brain surgery. His paternal grandmother and grandfather had lung cancer. He had second hand smoke exposure from his step father in childhood.   Past Medical History:  Diagnosis Date   Asthma    Depression    GAD (generalized anxiety disorder)      No family history on file.   Social History   Socioeconomic History   Marital status: Married    Spouse name: Not on file   Number of children: Not on file   Years of education: Not on file   Highest education level: Not on file  Occupational History   Not on file   Tobacco Use   Smoking status: Former    Packs/day: 1    Types: Cigarettes    Start date: 03/01/2007    Quit date: 05/02/2022    Years since quitting: 0.4   Smokeless tobacco: Never  Substance and Sexual Activity   Alcohol use: Yes    Comment: 1-2 drinks 1-2 times per week   Drug use: Never   Sexual activity: Not on file  Other Topics Concern   Not on file  Social History Narrative   Not on file   Social Determinants of Health   Financial Resource Strain: Not on file  Food Insecurity: No Food Insecurity (05/22/2022)   Hunger Vital Sign    Worried About Running Out of Food in the Last Year: Never true    Ran Out of Food in the Last Year: Never true  Recent Concern: Food Insecurity - Food Insecurity Present (04/25/2022)   Hunger Vital Sign    Worried About [place] in the Last Year: Never true    Ran Out of Food in the Last Year: Sometimes true  Transportation Needs: No Transportation Needs (05/22/2022)   PRAPARE - Hydrologist (Medical): No    Lack of Transportation (Non-Medical): No  Physical Activity: Not on file  Stress: Not on file  Social Connections: Not on file  Intimate Partner Violence: Not At Risk (05/22/2022)   Humiliation, Afraid, Rape, and Kick questionnaire  Fear of Current or Ex-Partner: No    Emotionally Abused: No    Physically Abused: No    Sexually Abused: No     Allergies  Allergen Reactions   Iodinated Contrast Media Hives   Pineapple Hives, Itching and Swelling   Strawberry (Diagnostic) Hives, Itching and Swelling     Outpatient Medications Prior to Visit  Medication Sig Dispense Refill   albuterol (VENTOLIN HFA) 108 (90 Base) MCG/ACT inhaler Inhale 2 puffs into the lungs every 6 (six) hours as needed for wheezing or shortness of breath. 8 g 11   ipratropium-albuterol (DUONEB) 0.5-2.5 (3) MG/3ML SOLN Take 3 mLs by nebulization every 4 (four) hours. 360 mL 6   Misc Natural Products (AIRBORNE ELDERBERRY)  CHEW Chew 1-2 tablets by mouth daily.     mometasone-formoterol (DULERA) 100-5 MCG/ACT AERO Inhale 2 puffs into the lungs in the morning and at bedtime. 1 each 11   montelukast (SINGULAIR) 10 MG tablet Take 1 tablet (10 mg total) by mouth at bedtime. 30 tablet 11   pantoprazole (PROTONIX) 20 MG tablet Take 20 mg by mouth daily.     predniSONE (DELTASONE) 10 MG tablet 4 tabs for 2 days, then 3 tabs for 2 days, 2 tabs for 2 days, then 1 tab for 2 days, then stop 20 tablet 0   ZYRTEC ALLERGY 10 MG tablet Take 10 mg by mouth daily.     No facility-administered medications prior to visit.    Review of Systems  Constitutional:  Negative for chills, fever, malaise/fatigue and weight loss.  HENT:  Positive for congestion. Negative for sinus pain and sore throat.   Eyes: Negative.   Respiratory:  Positive for cough, shortness of breath and wheezing. Negative for hemoptysis and sputum production.   Cardiovascular:  Negative for chest pain, palpitations, orthopnea, claudication and leg swelling.  Gastrointestinal:  Positive for heartburn. Negative for abdominal pain, nausea and vomiting.  Genitourinary: Negative.   Musculoskeletal:  Negative for joint pain and myalgias.  Skin:  Negative for rash.  Neurological:  Positive for headaches. Negative for weakness.  Endo/Heme/Allergies: Negative.   Psychiatric/Behavioral:  The patient is nervous/anxious.       Objective:   There were no vitals filed for this visit.    Physical Exam Constitutional:      General: He is not in acute distress. HENT:     Head: Normocephalic and atraumatic.  Eyes:     Extraocular Movements: Extraocular movements intact.     Conjunctiva/sclera: Conjunctivae normal.     Pupils: Pupils are equal, round, and reactive to light.  Cardiovascular:     Rate and Rhythm: Normal rate and regular rhythm.     Pulses: Normal pulses.     Heart sounds: Normal heart sounds. No murmur heard. Pulmonary:     Effort: Pulmonary  effort is normal.     Breath sounds: Normal breath sounds.  Abdominal:     General: Bowel sounds are normal.     Palpations: Abdomen is soft.  Musculoskeletal:     Right lower leg: No edema.     Left lower leg: No edema.  Lymphadenopathy:     Cervical: No cervical adenopathy.  Skin:    General: Skin is warm and dry.  Neurological:     General: No focal deficit present.     Mental Status: He is alert.  Psychiatric:        Mood and Affect: Mood normal.        Behavior: Behavior normal.  Thought Content: Thought content normal.        Judgment: Judgment normal.       CBC    Component Value Date/Time   WBC 5.8 09/29/2022 1648   RBC 4.99 09/29/2022 1648   HGB 15.3 09/29/2022 1648   HCT 45.1 09/29/2022 1648   PLT 266.0 09/29/2022 1648   MCV 90.3 09/29/2022 1648   MCH 29.6 05/21/2022 2108   MCHC 33.9 09/29/2022 1648   RDW 14.4 09/29/2022 1648   LYMPHSABS 1.5 09/29/2022 1648   MONOABS 0.6 09/29/2022 1648   EOSABS 0.0 09/29/2022 1648   BASOSABS 0.0 09/29/2022 1648     Chest imaging: CXR 05/21/22 The heart size and mediastinal contours are within normal limits. Both lungs are clear. The visualized skeletal structures are unremarkable.  PFT:     No data to display          Labs:  Path:  Echo:  Heart Catheterization:  Assessment & Plan:   No diagnosis found.  Discussion: Royzell Arminio is a 32 year old male, former smoker who is referred to pulmonary clinic for asthma.   He has mild persistent ashtma. He is to continue dulera 100-20mcg 2 puffs twice daily and as needed albuterol/duonebs.  He is to continue zyrtec and montelukast for allergies.   We will check pulmonary function tests.  Follow up in 2 months with PFTs.   Freda Jackson, MD [facility] Office: [phone]   Current Outpatient Medications:    albuterol (VENTOLIN HFA) 108 (90 Base) MCG/ACT inhaler, Inhale 2 puffs into the lungs every 6 (six) hours as  needed for wheezing or shortness of breath., Disp: 8 g, Rfl: 11   ipratropium-albuterol (DUONEB) 0.5-2.5 (3) MG/3ML SOLN, Take 3 mLs by nebulization every 4 (four) hours., Disp: 360 mL, Rfl: 6   Misc Natural Products (AIRBORNE ELDERBERRY) CHEW, Chew 1-2 tablets by mouth daily., Disp: , Rfl:    mometasone-formoterol (DULERA) 100-5 MCG/ACT AERO, Inhale 2 puffs into the lungs in the morning and at bedtime., Disp: 1 each, Rfl: 11   montelukast (SINGULAIR) 10 MG tablet, Take 1 tablet (10 mg total) by mouth at bedtime., Disp: 30 tablet, Rfl: 11   pantoprazole (PROTONIX) 20 MG tablet, Take 20 mg by mouth daily., Disp: , Rfl:    predniSONE (DELTASONE) 10 MG tablet, 4 tabs for 2 days, then 3 tabs for 2 days, 2 tabs for 2 days, then 1 tab for 2 days, then stop, Disp: 20 tablet, Rfl: 0   ZYRTEC ALLERGY 10 MG tablet, Take 10 mg by mouth daily., Disp: , Rfl:

## 2022-12-07 ENCOUNTER — Encounter: Payer: Self-pay | Admitting: Pulmonary Disease

## 2022-12-07 ENCOUNTER — Ambulatory Visit (INDEPENDENT_AMBULATORY_CARE_PROVIDER_SITE_OTHER): Payer: Medicare Other | Admitting: Pulmonary Disease

## 2022-12-07 VITALS — BP 128/84 | HR 95 | Ht 68.0 in | Wt 280.4 lb

## 2022-12-07 DIAGNOSIS — J4540 Moderate persistent asthma, uncomplicated: Secondary | ICD-10-CM

## 2022-12-07 DIAGNOSIS — K219 Gastro-esophageal reflux disease without esophagitis: Secondary | ICD-10-CM

## 2022-12-07 DIAGNOSIS — J4530 Mild persistent asthma, uncomplicated: Secondary | ICD-10-CM | POA: Diagnosis not present

## 2022-12-07 LAB — PULMONARY FUNCTION TEST
DL/VA % pred: 125 %
DL/VA: 6.26 ml/min/mmHg/L
DLCO cor % pred: 122 %
DLCO cor: 37.6 ml/min/mmHg
DLCO unc % pred: 122 %
DLCO unc: 37.6 ml/min/mmHg
FEF 25-75 Post: 1.97 L/sec
FEF 25-75 Pre: 2.75 L/sec
FEF2575-%Change-Post: -28 %
FEF2575-%Pred-Post: 45 %
FEF2575-%Pred-Pre: 63 %
FEV1-%Change-Post: -25 %
FEV1-%Pred-Post: 55 %
FEV1-%Pred-Pre: 74 %
FEV1-Post: 2.36 L
FEV1-Pre: 3.15 L
FEV1FVC-%Change-Post: -27 %
FEV1FVC-%Pred-Pre: 88 %
FEV6-%Change-Post: 3 %
FEV6-%Pred-Post: 87 %
FEV6-%Pred-Pre: 84 %
FEV6-Post: 4.47 L
FEV6-Pre: 4.33 L
FEV6FVC-%Change-Post: 0 %
FEV6FVC-%Pred-Post: 101 %
FEV6FVC-%Pred-Pre: 100 %
FVC-%Change-Post: 3 %
FVC-%Pred-Post: 86 %
FVC-%Pred-Pre: 84 %
FVC-Post: 4.47 L
FVC-Pre: 4.34 L
Post FEV1/FVC ratio: 53 %
Post FEV6/FVC ratio: 100 %
Pre FEV1/FVC ratio: 73 %
Pre FEV6/FVC Ratio: 100 %
RV % pred: 146 %
RV: 2.18 L
TLC % pred: 100 %
TLC: 6.5 L

## 2022-12-07 MED ORDER — PANTOPRAZOLE SODIUM 20 MG PO TBEC
20.0000 mg | DELAYED_RELEASE_TABLET | Freq: Every day | ORAL | 5 refills | Status: AC
Start: 2022-12-07 — End: ?

## 2022-12-07 MED ORDER — MOMETASONE FURO-FORMOTEROL FUM 200-5 MCG/ACT IN AERO
2.0000 | INHALATION_SPRAY | Freq: Two times a day (BID) | RESPIRATORY_TRACT | 0 refills | Status: DC
Start: 2022-12-07 — End: 2023-11-29

## 2022-12-07 NOTE — Progress Notes (Signed)
Full PFT performed today. °

## 2022-12-07 NOTE — Patient Instructions (Addendum)
Start dulera 200-76mcg 2 puffs twice daily - rinse mouth out after each use  Use albuterol inhaler or duonebs as needed   Continue montelukast 10mg  daily  Continue zyrtec allergy medicine  Follow up in 3 months

## 2022-12-07 NOTE — Patient Instructions (Signed)
Full PFT performed today. °

## 2022-12-07 NOTE — Progress Notes (Signed)
Synopsis: Referred in November 2023 for asthma  Subjective:   PATIENT ID: Tommy Russell GENDER: male DOB: [birth year], MRN: [id number]  HPI  Chief Complaint  Patient presents with   Follow-up    F/U after PFT. States he had a flare last week but is feeling better today.    Tommy Russell is a 32 year old male, former smoker who returns to pulmonary clinic for asthma.   Initial OV 06/21/22 He was recently admitted for asthma exacerbation 10/21 to 10/22 where he was treated with steroids and nebulizer treatments. He initially required oxygen. He was discharged with steroid taper along with dulera 100-54mcg 2 puffs twice daily, zyrtec, montelukast and as needed albuterol inhaler and duoneb nebulizer treatment. He has done well since discharge.  He does have intermittent reflux symptoms and is not currently taking any medications. 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 He has issues with seasonal allergies, more so in the spring/summer time.   He quit smoking 2 months ago, he was smoking a few cigarettes per week. 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 He denies breathing issues during that time. He reports having increased infections since having brain surgery. His paternal grandmother and grandfather had lung cancer. He had second hand smoke exposure from his step father in childhood.   OV 12/07/22 He has been doing well on dulera 100-57mcg 2 puffs twice daily and using albuterol 2 times per week. He would have night time awakenings about 3 times per week. He recently had increased symptoms over the past week where he used his nebs for 1 week straight with improvement over the past couple of days. He is having sinus congestion. Not using nasal sprays.   PFTs show difficulty in reproducibility, but mild obstruction based on flow - volume pattern.   Past Medical History:  Diagnosis Date    Asthma    Depression    GAD (generalized anxiety disorder)      No family history on file.   Social History   Socioeconomic History   Marital status: Married    Spouse name: Not on file   Number of children: Not on file   Years of education: Not on file   Highest education level: Not on file  Occupational History   Not on file  Tobacco Use   Smoking status: Former    Packs/day: 1    Types: Cigarettes    Start date: 03/01/2007    Quit date: 05/02/2022    Years since quitting: 0.6   Smokeless tobacco: Never  Substance and Sexual Activity   Alcohol use: Yes    Comment: 1-2 drinks 1-2 times per week   Drug use: Never   Sexual activity: Not on file  Other Topics Concern   Not on file  Social History Narrative   Not on file   Social Determinants of Health   Financial Resource Strain: Not on file  Food Insecurity: No Food Insecurity (05/22/2022)   Hunger Vital Sign    Worried About Running Out of Food in the Last Year: Never true    Ran Out of Food in the Last Year: Never true  Recent Concern: Food Insecurity - Food Insecurity Present (04/25/2022)   Hunger Vital Sign    Worried About Running Out of Food in the Last Year: Never true    Ran Out of Food in the Last Year:  Sometimes true  Transportation Needs: No Transportation Needs (05/22/2022)   PRAPARE - Administrator, Civil Service (Medical): No    Lack of Transportation (Non-Medical): No  Physical Activity: Not on file  Stress: Not on file  Social Connections: Not on file  Intimate Partner Violence: Not At Risk (05/22/2022)   Humiliation, Afraid, Rape, and Kick questionnaire    Fear of Current or Ex-Partner: No    Emotionally Abused: No    Physically Abused: No    Sexually Abused: No     Allergies  Allergen Reactions   Iodinated Contrast Media Hives   Pineapple Hives, Itching and Swelling   Strawberry (Diagnostic) Hives, Itching and Swelling     Outpatient Medications Prior to Visit  Medication  Sig Dispense Refill   albuterol (VENTOLIN HFA) 108 (90 Base) MCG/ACT inhaler Inhale 2 puffs into the lungs every 6 (six) hours as needed for wheezing or shortness of breath. 8 g 11   ipratropium-albuterol (DUONEB) 0.5-2.5 (3) MG/3ML SOLN Take 3 mLs by nebulization every 4 (four) hours. 360 mL 6   montelukast (SINGULAIR) 10 MG tablet Take 1 tablet (10 mg total) by mouth at bedtime. 30 tablet 11   Multiple Vitamin (MULTIVITAMIN) capsule Take 1 capsule by mouth daily.     ZYRTEC ALLERGY 10 MG tablet Take 10 mg by mouth daily.     mometasone-formoterol (DULERA) 100-5 MCG/ACT AERO Inhale 2 puffs into the lungs in the morning and at bedtime. 1 each 11   pantoprazole (PROTONIX) 20 MG tablet Take 20 mg by mouth daily.     Misc Natural Products (AIRBORNE ELDERBERRY) CHEW Chew 1-2 tablets by mouth daily.     predniSONE (DELTASONE) 10 MG tablet 4 tabs for 2 days, then 3 tabs for 2 days, 2 tabs for 2 days, then 1 tab for 2 days, then stop 20 tablet 0   No facility-administered medications prior to visit.   Review of Systems  Constitutional:  Negative for chills, fever, malaise/fatigue and weight loss.  HENT:  Positive for congestion. Negative for sinus pain and sore throat.   Eyes: Negative.   Respiratory:  Negative for cough, hemoptysis, sputum production, shortness of breath and wheezing.   Cardiovascular:  Negative for chest pain, palpitations, orthopnea, claudication and leg swelling.  Gastrointestinal:  Negative for abdominal pain, heartburn, nausea and vomiting.  Genitourinary: Negative.   Musculoskeletal:  Negative for joint pain and myalgias.  Skin:  Negative for rash.  Neurological:  Negative for weakness and headaches.  Endo/Heme/Allergies: Negative.    Objective:   Vitals:   12/07/22 1504  BP: 128/84  Pulse: 95  SpO2: 97%  Weight: 280 lb 6.4 oz (127.2 kg)  Height: 5\' 8"  (1.727 m)   Physical Exam Constitutional:      General: He is not in acute distress. HENT:     Head:  Normocephalic and atraumatic.  Eyes:     Extraocular Movements: Extraocular movements intact.     Conjunctiva/sclera: Conjunctivae normal.     Pupils: Pupils are equal, round, and reactive to light.  Cardiovascular:     Rate and Rhythm: Normal rate and regular rhythm.     Pulses: Normal pulses.     Heart sounds: Normal heart sounds. No murmur heard. Pulmonary:     Effort: Pulmonary effort is normal.     Breath sounds: Normal breath sounds.  Abdominal:     General: Bowel sounds are normal.     Palpations: Abdomen is soft.  Musculoskeletal:  Right lower leg: No edema.     Left lower leg: No edema.  Lymphadenopathy:     Cervical: No cervical adenopathy.  Skin:    General: Skin is warm and dry.  Neurological:     General: No focal deficit present.     Mental Status: He is alert.  Psychiatric:        Mood and Affect: Mood normal.        Behavior: Behavior normal.        Thought Content: Thought content normal.        Judgment: Judgment normal.    CBC    Component Value Date/Time   WBC 5.8 09/29/2022 1648   RBC 4.99 09/29/2022 1648   HGB 15.3 09/29/2022 1648   HCT 45.1 09/29/2022 1648   PLT 266.0 09/29/2022 1648   MCV 90.3 09/29/2022 1648   MCH 29.6 05/21/2022 2108   MCHC 33.9 09/29/2022 1648   RDW 14.4 09/29/2022 1648   LYMPHSABS 1.5 09/29/2022 1648   MONOABS 0.6 09/29/2022 1648   EOSABS 0.0 09/29/2022 1648   BASOSABS 0.0 09/29/2022 1648      Latest Ref Rng & Units 05/23/2022    6:40 AM 05/21/2022    9:08 PM 04/25/2022    3:44 AM  BMP  Glucose 70 - 99 mg/dL 829  87  562   BUN 6 - 20 mg/dL 15  10  11    Creatinine 0.61 - 1.24 mg/dL 1.30  8.65  7.84   Sodium 135 - 145 mmol/L 139  137  138   Potassium 3.5 - 5.1 mmol/L 3.7  4.2  5.0   Chloride 98 - 111 mmol/L 105  105  109   CO2 22 - 32 mmol/L 25  23  22    Calcium 8.9 - 10.3 mg/dL 9.1  9.8  9.4    Chest imaging: CXR 05/21/22 The heart size and mediastinal contours are within normal limits. Both lungs are  clear. The visualized skeletal structures are unremarkable.  PFT:    Latest Ref Rng & Units 12/07/2022    1:57 PM  PFT Results  FVC-Pre L 4.34  P  FVC-Predicted Pre % 84  P  FVC-Post L 4.47  P  FVC-Predicted Post % 86  P  Pre FEV1/FVC % % 73  P  Post FEV1/FCV % % 53  P  FEV1-Pre L 3.15  P  FEV1-Predicted Pre % 74  P  FEV1-Post L 2.36  P  DLCO uncorrected ml/min/mmHg 37.60  P  DLCO UNC% % 122  P  DLCO corrected ml/min/mmHg 37.60  P  DLCO COR %Predicted % 122  P  DLVA Predicted % 125  P  TLC L 6.50  P  TLC % Predicted % 100  P  RV % Predicted % 146  P    P Preliminary result   Labs:  Path:  Echo:  Heart Catheterization:  Assessment & Plan:   Gastroesophageal reflux disease without esophagitis - Plan: pantoprazole (PROTONIX) 20 MG tablet  Moderate persistent asthma without complication - Plan: mometasone-formoterol (DULERA) 200-5 MCG/ACT AERO  Discussion: Damacio Claverie is a 32 year old male, former smoker who returns to pulmonary clinic for asthma.   He has moderate persistent asthma. We will increase dulera to 200-44mcg 2 puffs twice daily and as needed albuterol/duonebs.   He is to continue zyrtec and montelukast for allergies. Recommend as needed flonase for nasal/sinus congestion.  He is to start pantoprazole 20mg  daily for reflux.   Follow up in  3 months  Melody Comas, MD [facility] Office: [phone]   Current Outpatient Medications:    albuterol (VENTOLIN HFA) 108 (90 Base) MCG/ACT inhaler, Inhale 2 puffs into the lungs every 6 (six) hours as needed for wheezing or shortness of breath., Disp: 8 g, Rfl: 11   ipratropium-albuterol (DUONEB) 0.5-2.5 (3) MG/3ML SOLN, Take 3 mLs by nebulization every 4 (four) hours., Disp: 360 mL, Rfl: 6   mometasone-formoterol (DULERA) 200-5 MCG/ACT AERO, Inhale 2 puffs into the lungs 2 (two) times daily., Disp: 1 each, Rfl: 0   montelukast (SINGULAIR) 10 MG tablet, Take 1 tablet (10 mg total) by  mouth at bedtime., Disp: 30 tablet, Rfl: 11   Multiple Vitamin (MULTIVITAMIN) capsule, Take 1 capsule by mouth daily., Disp: , Rfl:    ZYRTEC ALLERGY 10 MG tablet, Take 10 mg by mouth daily., Disp: , Rfl:    pantoprazole (PROTONIX) 20 MG tablet, Take 1 tablet (20 mg total) by mouth daily., Disp: 30 tablet, Rfl: 5

## 2022-12-08 ENCOUNTER — Other Ambulatory Visit (HOSPITAL_COMMUNITY): Payer: Self-pay

## 2022-12-08 ENCOUNTER — Telehealth: Payer: Self-pay

## 2022-12-08 NOTE — Telephone Encounter (Signed)
Request for ICS/LABA test claims show coverage for:   Breo-$45.00 Dulera-refill too soon until 05/30 Advair Diskus-$45.00 Advair HFA-$45.00 Symbicort-$45.00

## 2022-12-09 ENCOUNTER — Encounter: Payer: Self-pay | Admitting: Pulmonary Disease

## 2023-01-06 DIAGNOSIS — R7989 Other specified abnormal findings of blood chemistry: Secondary | ICD-10-CM | POA: Diagnosis not present

## 2023-01-06 DIAGNOSIS — K219 Gastro-esophageal reflux disease without esophagitis: Secondary | ICD-10-CM | POA: Diagnosis not present

## 2023-01-13 DIAGNOSIS — Z0000 Encounter for general adult medical examination without abnormal findings: Secondary | ICD-10-CM | POA: Diagnosis not present

## 2023-01-13 DIAGNOSIS — Z1331 Encounter for screening for depression: Secondary | ICD-10-CM | POA: Diagnosis not present

## 2023-01-13 DIAGNOSIS — E785 Hyperlipidemia, unspecified: Secondary | ICD-10-CM | POA: Diagnosis not present

## 2023-01-13 DIAGNOSIS — Z23 Encounter for immunization: Secondary | ICD-10-CM | POA: Diagnosis not present

## 2023-01-25 ENCOUNTER — Encounter: Payer: Self-pay | Admitting: Internal Medicine

## 2023-02-02 ENCOUNTER — Telehealth: Payer: Self-pay | Admitting: Pulmonary Disease

## 2023-02-02 MED ORDER — PREDNISONE 10 MG PO TABS: ORAL_TABLET | ORAL | 0 refills | Status: DC

## 2023-02-02 NOTE — Telephone Encounter (Signed)
Spoke with patient regarding prior message. Advised patient per Florentina Addison recommendation's    Ok to send in prednisone taper 10 mg tabs - 4 tabs for 2 days, then 3 tabs for 2 days, 2 tabs for 2 days, then 1 tab for 2 days, then stop. Take in AM with food. Would not recommend abx at this time given lack of infectious symptoms. He should use delsym 2 tsp Twice daily over the counter for cough. Thanks.    Script was sent into pharmacy of choice and patient's voice was understanding. Nothing else further needed at this time

## 2023-02-02 NOTE — Telephone Encounter (Signed)
Ok to send in prednisone taper 10 mg tabs - 4 tabs for 2 days, then 3 tabs for 2 days, 2 tabs for 2 days, then 1 tab for 2 days, then stop. Take in AM with food. Would not recommend abx at this time given lack of infectious symptoms. He should use delsym 2 tsp Twice daily over the counter for cough. Thanks.

## 2023-02-02 NOTE — Telephone Encounter (Signed)
Pt. Having coughing spells and would like to see if he can getpredniSONE (DELTASONE) 10 MG  or antibiotic please advise

## 2023-02-02 NOTE — Telephone Encounter (Signed)
Spoke with patient regarding having coughing spells for a week 0 fever,0 chill's,0 color in phlegm.Patient will like prednisone or antibiotic's sent in.   Tommy Russell can you please advise.

## 2023-02-07 NOTE — Progress Notes (Signed)
FMLA Forms for Mother completed as requested. Copy of forms emailed as requested. Fax transmission confirmation received. No other needs or concerns noted at this time.

## 2023-03-13 NOTE — Progress Notes (Deleted)
@Patient  ID: Tommy Russell, male    DOB: 22-Jan-1991, 32 y.o.   MRN: [id number]  No chief complaint on file.   Referring provider: Melida Quitter, MD  HPI: 32 year old male, former smoker. PMH significant for asthma, CAP,    03/14/2023 Patient presents today for follow-up moderate persistent asthma/GERD. Increased Dulera and started on Pantoprazole 20mg  daily for reflux.          Allergies  Allergen Reactions   Iodinated Contrast Media Hives   Pineapple Hives, Itching and Swelling   Strawberry (Diagnostic) Hives, Itching and Swelling    Immunization History  Administered Date(s) Administered   PNEUMOCOCCAL CONJUGATE-20 05/23/2022    Past Medical History:  Diagnosis Date   Asthma    Depression    GAD (generalized anxiety disorder)     Tobacco History: Social History   Tobacco Use  Smoking Status Former   Current packs/day: 0.00   Average packs/day: 1 pack/day for 15.2 years (15.2 ttl pk-yrs)   Types: Cigarettes   Start date: 03/01/2007   Quit date: 05/02/2022   Years since quitting: 0.8  Smokeless Tobacco Never   Counseling given: Not Answered   Outpatient Medications Prior to Visit  Medication Sig Dispense Refill   albuterol (VENTOLIN HFA) 108 (90 Base) MCG/ACT inhaler Inhale 2 puffs into the lungs every 6 (six) hours as needed for wheezing or shortness of breath. 8 g 11   ipratropium-albuterol (DUONEB) 0.5-2.5 (3) MG/3ML SOLN Take 3 mLs by nebulization every 4 (four) hours. 360 mL 6   mometasone-formoterol (DULERA) 200-5 MCG/ACT AERO Inhale 2 puffs into the lungs 2 (two) times daily. 1 each 0   montelukast (SINGULAIR) 10 MG tablet Take 1 tablet (10 mg total) by mouth at bedtime. 30 tablet 11   Multiple Vitamin (MULTIVITAMIN) capsule Take 1 capsule by mouth daily.     pantoprazole (PROTONIX) 20 MG tablet Take 1 tablet (20 mg total) by mouth daily. 30 tablet 5   predniSONE (DELTASONE) 10 MG tablet Take 4 tabs for 2 days, then 3 tabs for 2 days, 2  tabs for 2 days, then 1 tab for 2 days, then stop. 20 tablet 0   ZYRTEC ALLERGY 10 MG tablet Take 10 mg by mouth daily.     No facility-administered medications prior to visit.      Review of Systems  Review of Systems   Physical Exam  There were no vitals taken for this visit. Physical Exam   Lab Results:  CBC    Component Value Date/Time   WBC 5.8 09/29/2022 1648   RBC 4.99 09/29/2022 1648   HGB 15.3 09/29/2022 1648   HCT 45.1 09/29/2022 1648   PLT 266.0 09/29/2022 1648   MCV 90.3 09/29/2022 1648   MCH 29.6 05/21/2022 2108   MCHC 33.9 09/29/2022 1648   RDW 14.4 09/29/2022 1648   LYMPHSABS 1.5 09/29/2022 1648   MONOABS 0.6 09/29/2022 1648   EOSABS 0.0 09/29/2022 1648   BASOSABS 0.0 09/29/2022 1648    BMET    Component Value Date/Time   NA 139 05/23/2022 0640   K 3.7 05/23/2022 0640   CL 105 05/23/2022 0640   CO2 25 05/23/2022 0640   GLUCOSE 103 (H) 05/23/2022 0640   BUN 15 05/23/2022 0640   CREATININE 1.06 05/23/2022 0640   CALCIUM 9.1 05/23/2022 0640   GFRNONAA >60 05/23/2022 0640   GFRAA >60 12/01/2019 0226    BNP    Component Value Date/Time   BNP 5.7 04/24/2022 1554  ProBNP No results found for: "PROBNP"  Imaging: No results found.   Assessment & Plan:   No problem-specific Assessment & Plan notes found for this encounter.     Glenford Bayley, NP 03/13/2023

## 2023-03-14 ENCOUNTER — Ambulatory Visit: Payer: Medicare Other | Admitting: Primary Care

## 2023-05-23 ENCOUNTER — Telehealth: Payer: Self-pay | Admitting: Pulmonary Disease

## 2023-05-23 ENCOUNTER — Encounter: Payer: Self-pay | Admitting: Pulmonary Disease

## 2023-05-23 ENCOUNTER — Other Ambulatory Visit: Payer: Self-pay | Admitting: Pulmonary Disease

## 2023-05-23 MED ORDER — PREDNISONE 20 MG PO TABS: 20.0000 mg | ORAL_TABLET | Freq: Every day | ORAL | 0 refills | Status: DC

## 2023-05-23 MED ORDER — AZITHROMYCIN 250 MG PO TABS: ORAL_TABLET | ORAL | 0 refills | Status: DC

## 2023-05-23 NOTE — Telephone Encounter (Signed)
Called and spoke to patient.  He stated that his daughter were sick lat week, he feels that he now getting sick. C/o increased SOB with coughing spells and exertion, stuffy nose, prod cough with yellow to greenish sputum, wheezing and throat clearing x3d. He is using albuterol solution TID and Dulera BID.  Neg for covid three days ago.  AO, please advise. Dr. Francine Graven is unavailable.

## 2023-05-23 NOTE — Telephone Encounter (Signed)
Patient is currently sick. Having stuffy nose and cough. Needs for medication to be called in; steroid and antibiotic.   Pharmacy [facility] on [address]

## 2023-05-23 NOTE — Telephone Encounter (Signed)
Please refer to 05/23/23 mychart visit. Prednisone and abx sent to pharmacy. AO notified patient.  Nothing further needed/

## 2023-08-02 ENCOUNTER — Other Ambulatory Visit: Payer: Self-pay | Admitting: Pulmonary Disease

## 2023-08-02 DIAGNOSIS — J4530 Mild persistent asthma, uncomplicated: Secondary | ICD-10-CM

## 2023-08-04 ENCOUNTER — Other Ambulatory Visit: Payer: Self-pay | Admitting: Pulmonary Disease

## 2023-08-04 DIAGNOSIS — J4530 Mild persistent asthma, uncomplicated: Secondary | ICD-10-CM

## 2023-09-22 ENCOUNTER — Telehealth: Payer: Self-pay | Admitting: *Deleted

## 2023-09-22 NOTE — Telephone Encounter (Signed)
 PC to patient, no answer, left VM - informed patient he has brain MRI which needs to be scheduled before his upcoming appointment with Dr Barbaraann Cao on 10/10/23.  Instructed patient to call Central Scheduling to schedule, number given.  Also instructed him to call this office with any questions/concerns, [phone].

## 2023-09-27 ENCOUNTER — Telehealth: Payer: Self-pay | Admitting: *Deleted

## 2023-09-27 NOTE — Telephone Encounter (Signed)
 PC to patient, spoke with his wife [name] - informed him brain MRI needs to be scheduled before patient's appointment with Dr Barbaraann Cao on 10/10/23.  She requests that MRI be scheduled the morning of March 10 & an afternoon appointment with Dr Barbaraann Cao the same day because she has to take a day off of work to bring the patient.  MRI scheduled at [facility] at 7:00 on 10/10/23, they are to arrive at 6:30.  MD appointment changed to 2:30 the same day.  [name] verbalizes understanding.

## 2023-10-10 ENCOUNTER — Inpatient Hospital Stay: Payer: Medicare Other | Attending: Internal Medicine | Admitting: Internal Medicine

## 2023-10-10 ENCOUNTER — Ambulatory Visit (HOSPITAL_COMMUNITY)
Admission: RE | Admit: 2023-10-10 | Discharge: 2023-10-10 | Disposition: A | Discharge status: Home / Self Care | Payer: Medicare Other | Source: Ambulatory Visit | Attending: Internal Medicine | Admitting: Internal Medicine

## 2023-10-10 ENCOUNTER — Ambulatory Visit: Payer: Medicare Other | Admitting: Internal Medicine

## 2023-10-10 VITALS — BP 136/94 | HR 84 | Temp 98.0°F | Resp 16 | Wt 284.6 lb

## 2023-10-10 DIAGNOSIS — R519 Headache, unspecified: Secondary | ICD-10-CM | POA: Diagnosis not present

## 2023-10-10 DIAGNOSIS — G9389 Other specified disorders of brain: Secondary | ICD-10-CM | POA: Diagnosis not present

## 2023-10-10 DIAGNOSIS — D497 Neoplasm of unspecified behavior of endocrine glands and other parts of nervous system: Secondary | ICD-10-CM | POA: Diagnosis not present

## 2023-10-10 DIAGNOSIS — Z982 Presence of cerebrospinal fluid drainage device: Secondary | ICD-10-CM | POA: Diagnosis not present

## 2023-10-10 MED ORDER — GADOBUTROL 1 MMOL/ML IV SOLN
10.0000 mL | Freq: Once | INTRAVENOUS | Status: AC | PRN
  Administered 2023-10-10: 10 mL via INTRAVENOUS

## 2023-10-10 NOTE — Progress Notes (Signed)
 [facility] at [facility] [address]  [place], [place] [address] [phone]   Interval Evaluation  Date of Service: 10/10/23 Patient Name: Tommy Russell Patient MRN: [id number] Patient DOB: April 26, 1991 Provider: Henreitta Leber, MD  Identifying Statement:  Tommy Russell is a 33 y.o. 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 Franky Macho and Dr. Maisie Fus  Biomarkers:  MGMT Unknown.  IDH 1/2 Unknown.  EGFR Unknown  TERT Unknown   Interval History:  Tommy Russell presents today for follow up after recent MRI brain. Continues to experience dense visual impairment, continues to follow with ophthalmologist at [facility].  No seizures over the past year.  He does describe daily tension headaches, sleep is very inconsistent and accompanied by heavy snoring, daytime sleepiness.  No other new or progressive changes.  H+P (12/28/19) Patient presented to medical attention in April 2021 after several weeks of progressive blurry vision.  Several days before presentation, he began to experience severe morning headaches, different from typical tension headaches.  In addition, his walking and balance became somewhat impaired, although he never required any assist or had any falls.  CNS imaging demonstrated large cystic mass in the posterior fossa with accompanying hydrocephalus.  Mass was resected on 11/29/19 and accompanied by EVD placement.  Drain was removed after several days, and patient improved clinically except for visual impairment.  At this time he feels back to normal physically, vision is still very blurry.    Medications: Current Outpatient Medications on File Prior to Visit  Medication Sig Dispense Refill   mometasone-formoterol (DULERA) 200-5 MCG/ACT AERO Inhale 2 puffs into the lungs 2 (two) times daily. 1 each 0   montelukast (SINGULAIR) 10 MG tablet TAKE 1 TABLET(10 MG) BY  MOUTH AT BEDTIME 30 tablet 2   Multiple Vitamin (MULTIVITAMIN) capsule Take 1 capsule by mouth daily.     pantoprazole (PROTONIX) 20 MG tablet Take 1 tablet (20 mg total) by mouth daily. 30 tablet 5   ZYRTEC ALLERGY 10 MG tablet Take 10 mg by mouth daily.     albuterol (VENTOLIN HFA) 108 (90 Base) MCG/ACT inhaler Inhale 2 puffs into the lungs every 6 (six) hours as needed for wheezing or shortness of breath. (Patient not taking: Reported on 10/10/2023) 8 g 11   ipratropium-albuterol (DUONEB) 0.5-2.5 (3) MG/3ML SOLN Take 3 mLs by nebulization every 4 (four) hours. (Patient not taking: Reported on 10/10/2023) 360 mL 6   No current facility-administered medications on file prior to visit.    Allergies:  Allergies  Allergen Reactions   Iodinated Contrast Media Hives   Pineapple Hives, Itching and Swelling   Strawberry (Diagnostic) Hives, Itching and Swelling   Past Medical History:  Past Medical History:  Diagnosis Date   Asthma    Depression    GAD (generalized anxiety disorder)    Past Surgical History:  Past Surgical History:  Procedure Laterality Date   SUBOCCIPITAL CRANIECTOMY CERVICAL LAMINECTOMY N/A 11/29/2019   Procedure: SUBOCCIPITAL CRANIECTOMY CERVICAL LAMINECTOMY/DURAPLASTY FOR RESECTION OF MASSS AND PLACEMENT OF EXTERNAL VENTRICULAR DRAIN ;  Surgeon: Bedelia Person, MD;  Location: [facility] OR;  Service: Neurosurgery;  Laterality: N/A;   Social History:  Social History   Socioeconomic History   Marital status: Married    Spouse name: Not on file   Number of children: Not on file   Years of education: Not on file   Highest education level: Not  on file  Occupational History   Not on file  Tobacco Use   Smoking status: Former    Current packs/day: 0.00    Average packs/day: 1 pack/day for 15.2 years (15.2 ttl pk-yrs)    Types: Cigarettes    Start date: 03/01/2007    Quit date: 05/02/2022    Years since quitting: 1.4   Smokeless tobacco: Never  Substance and Sexual  Activity   Alcohol use: Yes    Comment: 1-2 drinks 1-2 times per week   Drug use: Never   Sexual activity: Not on file  Other Topics Concern   Not on file  Social History Narrative   Not on file   Social Drivers of Health   Financial Resource Strain: Not on file  Food Insecurity: No Food Insecurity (05/22/2022)   Hunger Vital Sign    Worried About Running Out of Food in the Last Year: Never true    Ran Out of Food in the Last Year: Never true  Recent Concern: Food Insecurity - Food Insecurity Present (04/25/2022)   Hunger Vital Sign    Worried About Running Out of Food in the Last Year: Never true    Ran Out of Food in the Last Year: Sometimes true  Transportation Needs: No Transportation Needs (05/22/2022)   PRAPARE - Administrator, Civil Service (Medical): No    Lack of Transportation (Non-Medical): No  Physical Activity: Not on file  Stress: Not on file  Social Connections: Not on file  Intimate Partner Violence: Not At Risk (05/22/2022)   Humiliation, Afraid, Rape, and Kick questionnaire    Fear of Current or Ex-Partner: No    Emotionally Abused: No    Physically Abused: No    Sexually Abused: No   Family History: No family history on file.  Review of Systems: Constitutional: Doesn't report fevers, chills or abnormal weight loss Eyes: Doesn't report blurriness of vision Ears, nose, mouth, throat, and face: Doesn't report sore throat Respiratory: Doesn't report cough, dyspnea or wheezes Cardiovascular: Doesn't report palpitation, chest discomfort  Gastrointestinal:  Doesn't report nausea, constipation, diarrhea GU: Doesn't report incontinence Skin: Doesn't report skin rashes Neurological: Per HPI Musculoskeletal: Doesn't report joint pain Behavioral/Psych: Doesn't report anxiety  Physical Exam: Vitals:   10/10/23 1452  BP: (!) 136/94  Pulse: 84  Resp: 16  Temp: 98 F (36.7 C)  SpO2: 91%   KPS: 80. General: Alert, cooperative, pleasant, in no  acute distress Head: Normal EENT: No conjunctival injection or scleral icterus.  Lungs: Resp effort normal Cardiac: Regular rate Abdomen: Non-distended abdomen Skin: No rashes cyanosis or petechiae. Extremities: No clubbing or edema  Neurologic Exam: Mental Status: Awake, alert, attentive to examiner. Oriented to self and environment. Language is fluent with intact comprehension.  Cranial Nerves: Visual acuity is impaired in both eyes. Visual fields are full. Extra-ocular movements intact. No ptosis. Face is symmetric Motor: Tone and bulk are normal. Power is full in both arms and legs. Reflexes are symmetric, no pathologic reflexes present.  Sensory: Intact to light touch Gait: Independent, impaired tandem.   Labs: I have reviewed the data as listed    Component Value Date/Time   NA 139 05/23/2022 0640   K 3.7 05/23/2022 0640   CL 105 05/23/2022 0640   CO2 25 05/23/2022 0640   GLUCOSE 103 (H) 05/23/2022 0640   BUN 15 05/23/2022 0640   CREATININE 1.06 05/23/2022 0640   CALCIUM 9.1 05/23/2022 0640   PROT 7.6 05/23/2022 0640  ALBUMIN 4.0 05/23/2022 0640   AST 16 05/23/2022 0640   ALT 18 05/23/2022 0640   ALKPHOS 77 05/23/2022 0640   BILITOT 0.7 05/23/2022 0640   GFRNONAA >60 05/23/2022 0640   GFRAA >60 12/01/2019 0226   Lab Results  Component Value Date   WBC 5.8 09/29/2022   NEUTROABS 3.6 09/29/2022   HGB 15.3 09/29/2022   HCT 45.1 09/29/2022   MCV 90.3 09/29/2022   PLT 266.0 09/29/2022   Imaging:  [facility] Clinician Interpretation: I have personally reviewed the CNS images as listed.  My interpretation, in the context of the patient's clinical presentation, is stable disease  MR BRAIN W WO CONTRAST Result Date: 10/10/2023 CLINICAL DATA:  33 year old male with history of left cerebellar mass resection in 2021. Pathology WHO grade 1 Glioneuronal tumor. Restaging. EXAM: MRI HEAD WITHOUT AND WITH CONTRAST TECHNIQUE: Multiplanar, multiecho pulse sequences of the brain and  surrounding structures were obtained without and with intravenous contrast. CONTRAST:  10mL GADAVIST GADOBUTROL 1 MMOL/ML IV SOLN COMPARISON:  10/06/2022 brain MRI and earlier. FINDINGS: Brain: Left paracentral cerebellar resection cavity is stable and size and configuration. Overlying stable subtle encephalomalacia, hemosiderin on SWI. Following contrast no abnormal enhancement. No regional edema. No superimposed restricted diffusion to suggest acute infarction. No midline shift, mass effect, evidence of mass lesion, ventriculomegaly, extra-axial collection or acute intracranial hemorrhage. Cervicomedullary junction and pituitary are within normal limits. Unchanged susceptibility artifact along the posterior right scalp of unclear etiology, perhaps related to a previous external ventricular drain there with stable subtle tract through the right occipital lobe to the right occipital horn. And stable hemosiderin there. No new signal abnormality in the brain. No abnormal enhancement identified. No dural thickening. Vascular: Major intracranial vascular flow voids are stable. Following contrast the major dural venous sinuses are enhancing and appear to be patent. Skull and upper cervical spine: Stable and negative post craniotomy appearance. Negative visible cervical spine, spinal cord. Sinuses/Orbits: Negative orbits. Paranasal sinus mucosal thickening not significantly changed. Other: Mastoids remain clear. Visible internal auditory structures appear normal. Stable scalp soft tissues. IMPRESSION: Continued stable and satisfactory post treatment appearance of the brain. No new intracranial abnormality. Electronically Signed   By: Odessa Fleming M.D.   On: 10/10/2023 09:15     Assessment/Plan Glioneuronal tumor [D49.7]  We appreciate the opportunity to participate in the care of Tommy Russell.  He is clinically and radiographically stable today from oncologic standpoint.  Chronic headaches are suspected secondary to  poor sleep hygiene and possible obstructive sleep disorder.  His PCP is arranging for a sleep study at home.  We counseled on CDC sleep hygiene guidelines today.  We ask that Tommy Russell return to clinic in 12 months following next brain MRI, or sooner as needed.    All questions were answered. The patient knows to call the clinic with any problems, questions or concerns. No barriers to learning were detected.  I have spent a total of 40 minutes of face-to-face and non-face-to-face time, excluding clinical staff time, preparing to see patient, ordering tests and/or medications, counseling the patient, and independently interpreting results and communicating results to the patient/family/caregiver   Henreitta Leber, MD Medical Director of Neuro-Oncology [facility] at [facility] 10/10/23 2:50 PM

## 2023-10-11 ENCOUNTER — Telehealth: Payer: Self-pay | Admitting: Internal Medicine

## 2023-10-11 NOTE — Telephone Encounter (Signed)
 Left Message - Scheduled patient's appts. Advised patient to contact us if rescheduling is needed. Provided my direct line.

## 2023-10-12 ENCOUNTER — Telehealth: Payer: Self-pay | Admitting: Pulmonary Disease

## 2023-10-12 DIAGNOSIS — J4530 Mild persistent asthma, uncomplicated: Secondary | ICD-10-CM

## 2023-10-12 MED ORDER — MONTELUKAST SODIUM 10 MG PO TABS: 10.0000 mg | ORAL_TABLET | Freq: Every day | ORAL | 1 refills | Status: DC

## 2023-10-12 NOTE — Telephone Encounter (Signed)
 [name] states patient needs refill for Montelukast. Pharmacy is [facility] [address]. Patient scheduled 11/30/2023 with Dr, Francine Graven. [name] phone number is [phone].

## 2023-10-12 NOTE — Telephone Encounter (Signed)
 I called and spoke with pts wife, [name]. (DPR) [name] stated the pt needs a refill on Singulair. I informed [name] that I could send a refill to preferred pharmacy. [name] also stated that the pt was suppose to have a sleep study done, but was never contacted about it nor had the pt had one done. I informed [name] that this could be discussed further at upcoming appointment, since the pt would have to have a recent visit anyway. [name] verbalized understanding. NFN

## 2023-11-29 ENCOUNTER — Ambulatory Visit: Admitting: Pulmonary Disease

## 2023-11-29 ENCOUNTER — Telehealth: Payer: Self-pay

## 2023-11-29 DIAGNOSIS — F17210 Nicotine dependence, cigarettes, uncomplicated: Secondary | ICD-10-CM | POA: Diagnosis not present

## 2023-11-29 DIAGNOSIS — J4530 Mild persistent asthma, uncomplicated: Secondary | ICD-10-CM

## 2023-11-29 DIAGNOSIS — J4540 Moderate persistent asthma, uncomplicated: Secondary | ICD-10-CM

## 2023-11-29 DIAGNOSIS — J4541 Moderate persistent asthma with (acute) exacerbation: Secondary | ICD-10-CM | POA: Diagnosis not present

## 2023-11-29 MED ORDER — MONTELUKAST SODIUM 10 MG PO TABS: 10.0000 mg | ORAL_TABLET | Freq: Every day | ORAL | 6 refills | Status: DC

## 2023-11-29 MED ORDER — PREDNISONE 10 MG PO TABS: ORAL_TABLET | ORAL | 0 refills | Status: AC

## 2023-11-29 MED ORDER — MOMETASONE FURO-FORMOTEROL FUM 200-5 MCG/ACT IN AERO: 2.0000 | INHALATION_SPRAY | Freq: Two times a day (BID) | RESPIRATORY_TRACT | 6 refills | Status: DC

## 2023-11-29 MED ORDER — IPRATROPIUM-ALBUTEROL 0.5-2.5 (3) MG/3ML IN SOLN: 3.0000 mL | RESPIRATORY_TRACT | 6 refills | Status: AC

## 2023-11-29 NOTE — Telephone Encounter (Signed)
 Spoke with patient regarding prior message. Patient stated he is having some SOB,greenish yellow mucus and some chest congestion and having some asthma flare up's. Asked asked patient if he will be ok to do a Video visit with Dr.Dewald today and patient was ok with that this afternoon . Advised patient I will see when is the best time for patient to do a video visit after I talk with Dr.Dewald.Patient's voice was understanding.

## 2023-11-29 NOTE — Telephone Encounter (Signed)
ATC x1 LVM for patient to call our office back. 

## 2023-11-29 NOTE — Telephone Encounter (Signed)
 Copied from CRM [id number]. Topic: Clinical - Medication Refill >> Nov 28, 2023 12:12 PM Eveleen Hinds B wrote: Most Recent Primary Care Visit:   Medication: Prednisone   Has the patient contacted their pharmacy? No (Agent: If no, request that the patient contact the pharmacy for the refill. If patient does not wish to contact the pharmacy document the reason why and proceed with request.) (Agent: If yes, when and what did the pharmacy advise?)  Is this the correct pharmacy for this prescription? Yes If no, delete pharmacy and type the correct one.  This is the patient's preferred pharmacy:  [facility] #[id number], [place] - [address] AT [address] [place] [address] Phone: [phone] Fax: [phone]  [facility] #[id number] [place], [place] - [address] AT [facility] [address] [place] [address] Phone: [phone] Fax: [phone]   Has the prescription been filled recently? No  Is the patient out of the medication? No  Has the patient been seen for an appointment in the last year OR does the patient have an upcoming appointment? Yes  Can we respond through MyChart? Yes  Agent: Please be advised that Rx refills may take up to 3 business days. We ask that you follow-up with your pharmacy.

## 2023-11-29 NOTE — Telephone Encounter (Signed)
 What are patient's symptoms? He is not on daily prednisone .    If he feels he is having an asthma exacerbation we can add him on for video visit this afternoon.   Thanks,  JD

## 2023-11-29 NOTE — Progress Notes (Signed)
 Virtual Visit via Video Note  I connected with Tommy Russell on 11/29/23 at  1:15 PM EDT by a video enabled telemedicine application and verified that I am speaking with the correct person using two identifiers.  Location: Patient: home  Provider: clinic   I discussed the limitations of evaluation and management by telemedicine and the availability of in person appointments. The patient expressed understanding and agreed to proceed.  History of Present Illness: Tommy Russell is a 33 year old male, former smoker who returns to pulmonary clinic for asthma.   He reports increase in cough, wheezing and shortness of breath over the past week. He has been going to his sons baseball games outside and believes he is having increase in asthma symptoms due to allergies. He has remained on dulera  200 2 puffs twice daily, montelukast  and zyrtec. He has been using duonebs as needed, but feels they are wearing off by the next time he is due for it. Denies fevers, chills, muscle aches, sinus headache/pressure/pain.   Observations/Objective: Young male, no acute distress Intermittent coughing  Assessment and Plan: Moderate persistent asthma with exacerbation  - start extended steroid taper for exacerbation - continue dulera  200mcg 2 puffs twice daily - continue albuterol  inhaler 1-2 puffs every 4-6 hours or duonebs every 4-6 hours as needed - continue montelukast  10mg  daily - continue zyrtec daily  Follow Up Instructions: Follow up in 6 months   I discussed the assessment and treatment plan with the patient. The patient was provided an opportunity to ask questions and all were answered. The patient agreed with the plan and demonstrated an understanding of the instructions.   The patient was advised to call back or seek an in-person evaluation if the symptoms worsen or if the condition fails to improve as anticipated.  I provided 30 minutes of non-face-to-face time during this  encounter.   Wilfredo Hanly, MD

## 2023-11-29 NOTE — Patient Instructions (Addendum)
-   start extended steroid taper for exacerbation 40mg  prednisone  daily x 3 days 30mg  prednisone  daily x 3 days 20mg  prednisone  daily x 3 days 10mg  prednisone  daily x 3 days  - continue dulera  200mcg 2 puffs twice daily - continue albuterol  inhaler 1-2 puffs every 4-6 hours or duonebs every 4-6 hours as needed - continue montelukast  10mg  daily - continue zyrtec daily  Follow up in 6 months

## 2023-11-30 ENCOUNTER — Ambulatory Visit: Admitting: Pulmonary Disease

## 2023-11-30 ENCOUNTER — Telehealth: Payer: Self-pay

## 2023-11-30 DIAGNOSIS — J4541 Moderate persistent asthma with (acute) exacerbation: Secondary | ICD-10-CM

## 2023-11-30 MED ORDER — AZITHROMYCIN 250 MG PO TABS: ORAL_TABLET | ORAL | 0 refills | Status: DC

## 2023-11-30 NOTE — Telephone Encounter (Signed)
 Copied from CRM [id number]. Topic: Clinical - Medication Question >> Nov 30, 2023  4:21 PM [name] wrote: Reason for CRM: Patient has already started taking prednisone  and would like to have provider go ahead and send anti-biotic just in case. Please notify patient of decision.

## 2023-11-30 NOTE — Telephone Encounter (Signed)
 Patient assessed by Dr. Diania Fortes in the office in acute visit today, 11/30/2023.  Nothing further needed.

## 2023-11-30 NOTE — Telephone Encounter (Signed)
 Zpak sent to pharmacy  JD

## 2023-11-30 NOTE — Telephone Encounter (Signed)
Mailed AVS.

## 2023-12-01 ENCOUNTER — Telehealth: Payer: Self-pay | Admitting: Pulmonary Disease

## 2023-12-01 ENCOUNTER — Other Ambulatory Visit (HOSPITAL_COMMUNITY): Payer: Self-pay

## 2023-12-01 NOTE — Telephone Encounter (Signed)
 I called and spoke with the pt to let him know zpack was sent 11/30/23  He states that he went to pharmacy and was told that his insurance no longer covers Dulera  200  He is asking for alternative  Pharm team- will you please let Dr Diania Fortes know what med that is comparable to the Dulera  200 that is preferred by insurance?  Thanks!

## 2023-12-01 NOTE — Telephone Encounter (Signed)
 Dulera  is covered- patient may have a deductible to meet causing higher prices. Other test claims cannot be done due to the Dulera  being filled at the patients pharmacy.

## 2023-12-01 NOTE — Telephone Encounter (Signed)
 Pt notified that the zpack was sent  Nothing further needed

## 2023-12-06 NOTE — Telephone Encounter (Signed)
 Please let patient know the following from pharmacy regarding his dulera  inhaler.  Thanks, JD

## 2023-12-07 NOTE — Telephone Encounter (Signed)
 Spoke w/ PT he verbalized understanding, no further action needed

## 2023-12-14 ENCOUNTER — Ambulatory Visit: Admitting: Pulmonary Disease

## 2024-01-30 ENCOUNTER — Telehealth: Payer: Self-pay

## 2024-01-30 DIAGNOSIS — J4541 Moderate persistent asthma with (acute) exacerbation: Secondary | ICD-10-CM

## 2024-01-30 MED ORDER — PREDNISONE 10 MG PO TABS: ORAL_TABLET | ORAL | 0 refills | Status: AC

## 2024-01-30 MED ORDER — AZITHROMYCIN 250 MG PO TABS: ORAL_TABLET | ORAL | 0 refills | Status: DC

## 2024-01-30 NOTE — Telephone Encounter (Signed)
 Steroid taper and Zpak sent to pharmacy.  JD

## 2024-01-30 NOTE — Telephone Encounter (Unsigned)
 Copied from CRM [id number]. Topic: Clinical - Medication Question >> Jan 30, 2024 10:53 AM Benton O wrote: Reason for CRM: patient is calling to ask that the doctor send him in a z pack and prednisone  to pharmacy  [id number] - [place], [place] - [address] AT [address] [place] [address] Phone: [phone] Fax: [phone] Hours: Not open 24 hours

## 2024-01-31 NOTE — Telephone Encounter (Signed)
 noted

## 2024-01-31 NOTE — Telephone Encounter (Signed)
 Pt called to say thank you for sending in his prescriptions. He wanted to pass this message to all!!!

## 2024-02-07 ENCOUNTER — Telehealth: Payer: Self-pay

## 2024-02-07 ENCOUNTER — Encounter: Payer: Self-pay | Admitting: Pulmonary Disease

## 2024-02-07 ENCOUNTER — Ambulatory Visit (INDEPENDENT_AMBULATORY_CARE_PROVIDER_SITE_OTHER): Admitting: Pulmonary Disease

## 2024-02-07 VITALS — BP 119/85 | HR 82 | Ht 68.0 in | Wt 275.6 lb

## 2024-02-07 DIAGNOSIS — R0683 Snoring: Secondary | ICD-10-CM

## 2024-02-07 DIAGNOSIS — Z87891 Personal history of nicotine dependence: Secondary | ICD-10-CM

## 2024-02-07 DIAGNOSIS — J4541 Moderate persistent asthma with (acute) exacerbation: Secondary | ICD-10-CM | POA: Diagnosis not present

## 2024-02-07 MED ORDER — BREZTRI AEROSPHERE 160-9-4.8 MCG/ACT IN AERO: INHALATION_SPRAY | RESPIRATORY_TRACT | Status: DC

## 2024-02-07 MED ORDER — BREZTRI AEROSPHERE 160-9-4.8 MCG/ACT IN AERO: 2.0000 | INHALATION_SPRAY | Freq: Two times a day (BID) | RESPIRATORY_TRACT | 11 refills | Status: AC

## 2024-02-07 MED ORDER — MONTELUKAST SODIUM 10 MG PO TABS: 10.0000 mg | ORAL_TABLET | Freq: Every day | ORAL | 3 refills | Status: AC

## 2024-02-07 NOTE — Progress Notes (Signed)
 Synopsis: Referred in November 2023 for asthma  Subjective:   PATIENT ID: Tommy Russell GENDER: male DOB: [birth year], MRN: [id number]  HPI  Chief Complaint  Patient presents with   Follow-up    Pt states sleep test never came to the house    Tommy Russell is a 33 year old male, former smoker who returns to pulmonary clinic for asthma.   Initial OV 06/21/22 He was recently admitted for asthma exacerbation 10/21 to 10/22 where he was treated with steroids and nebulizer treatments. He initially required oxygen. He was discharged with steroid taper along with dulera  100-5mcg 2 puffs twice daily, zyrtec, montelukast  and as needed albuterol  inhaler and duoneb nebulizer treatment. He has done well since discharge.  He does have intermittent reflux symptoms and is not currently taking any medications. 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 He has issues with seasonal allergies, more so in the spring/summer time.   He quit smoking 2 months ago, he was smoking a few cigarettes per week. 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 He denies breathing issues during that time. He reports having increased infections since having brain surgery. His paternal grandmother and grandfather had lung cancer. He had second hand smoke exposure from his step father in childhood.   OV 12/07/22 He has been doing well on dulera  100-46mcg 2 puffs twice daily and using albuterol  2 times per week. He would have night time awakenings about 3 times per week. He recently had increased symptoms over the past week where he used his nebs for 1 week straight with improvement over the past couple of days. He is having sinus congestion. Not using nasal sprays.   PFTs show difficulty in reproducibility, but mild obstruction based on flow - volume pattern.   OV 02/07/24 He has been treated for asthma exacerbation 4 times in the past  12 months. He has been without dulera  inhaler over the past 2 months due to cost. He is feeling better with recent prednisone  taper that he will complete in the next couple of days.   He had elevated IgE level 975 in 09/2022.   He complains of snoring and headaches and also feeling fatigued throughout the day.  Past Medical History:  Diagnosis Date   Asthma    Depression    GAD (generalized anxiety disorder)      No family history on file.   Social History   Socioeconomic History   Marital status: Married    Spouse name: Not on file   Number of children: Not on file   Years of education: Not on file   Highest education level: Not on file  Occupational History   Not on file  Tobacco Use   Smoking status: Former    Current packs/day: 0.00    Average packs/day: 1 pack/day for 15.2 years (15.2 ttl pk-yrs)    Types: Cigarettes    Start date: 03/01/2007    Quit date: 05/02/2022    Years since quitting: 1.7   Smokeless tobacco: Never  Substance and Sexual Activity   Alcohol use: Yes    Comment: 1-2 drinks 1-2 times per week   Drug use: Never   Sexual activity: Not on file  Other Topics Concern   Not on file  Social History Narrative   Not on file   Social Drivers of Health   Financial Resource Strain: Not on  file  Food Insecurity: No Food Insecurity (05/22/2022)   Hunger Vital Sign    Worried About Running Out of Food in the Last Year: Never true    Ran Out of Food in the Last Year: Never true  Recent Concern: Food Insecurity - Food Insecurity Present (04/25/2022)   Hunger Vital Sign    Worried About Running Out of Food in the Last Year: Never true    Ran Out of Food in the Last Year: Sometimes true  Transportation Needs: No Transportation Needs (05/22/2022)   PRAPARE - Administrator, Civil Service (Medical): No    Lack of Transportation (Non-Medical): No  Physical Activity: Not on file  Stress: Not on file  Social Connections: Not on file  Intimate  Partner Violence: Not At Risk (05/22/2022)   Humiliation, Afraid, Rape, and Kick questionnaire    Fear of Current or Ex-Partner: No    Emotionally Abused: No    Physically Abused: No    Sexually Abused: No     Allergies  Allergen Reactions   Iodinated Contrast Media Hives   Pineapple Hives, Itching and Swelling   Strawberry (Diagnostic) Hives, Itching and Swelling     Outpatient Medications Prior to Visit  Medication Sig Dispense Refill   albuterol  (VENTOLIN  HFA) 108 (90 Base) MCG/ACT inhaler Inhale 2 puffs into the lungs every 6 (six) hours as needed for wheezing or shortness of breath. 8 g 11   azithromycin  (ZITHROMAX ) 250 MG tablet Take as directed 6 tablet 0   ipratropium-albuterol  (DUONEB) 0.5-2.5 (3) MG/3ML SOLN Take 3 mLs by nebulization every 4 (four) hours. 360 mL 6   Multiple Vitamin (MULTIVITAMIN) capsule Take 1 capsule by mouth daily.     pantoprazole  (PROTONIX ) 20 MG tablet Take 1 tablet (20 mg total) by mouth daily. 30 tablet 5   predniSONE  (DELTASONE ) 10 MG tablet Take 4 tablets (40 mg total) by mouth daily with breakfast for 3 days, THEN 3 tablets (30 mg total) daily with breakfast for 3 days, THEN 2 tablets (20 mg total) daily with breakfast for 3 days, THEN 1 tablet (10 mg total) daily with breakfast for 3 days. 30 tablet 0   ZYRTEC ALLERGY 10 MG tablet Take 10 mg by mouth daily.     mometasone -formoterol  (DULERA ) 200-5 MCG/ACT AERO Inhale 2 puffs into the lungs 2 (two) times daily. 1 each 6   montelukast  (SINGULAIR ) 10 MG tablet Take 1 tablet (10 mg total) by mouth at bedtime. 30 tablet 6   No facility-administered medications prior to visit.   Review of Systems  Constitutional:  Negative for chills, fever, malaise/fatigue and weight loss.  HENT:  Positive for congestion. Negative for sinus pain and sore throat.   Eyes: Negative.   Respiratory:  Positive for cough, shortness of breath and wheezing. Negative for hemoptysis and sputum production.   Cardiovascular:   Negative for chest pain, palpitations, orthopnea, claudication and leg swelling.  Gastrointestinal:  Negative for abdominal pain, heartburn, nausea and vomiting.  Genitourinary: Negative.   Musculoskeletal:  Negative for joint pain and myalgias.  Skin:  Negative for rash.  Neurological:  Negative for weakness and headaches.  Endo/Heme/Allergies: Negative.    Objective:   Vitals:   02/07/24 1425  BP: 119/85  Pulse: 82  SpO2: 95%  Weight: 275 lb 9.6 oz (125 kg)  Height: 5' 8 (1.727 m)    Physical Exam Constitutional:      General: He is not in acute distress.    Appearance: He  is obese.  HENT:     Head: Normocephalic and atraumatic.  Eyes:     Conjunctiva/sclera: Conjunctivae normal.  Cardiovascular:     Rate and Rhythm: Normal rate and regular rhythm.     Pulses: Normal pulses.     Heart sounds: Normal heart sounds. No murmur heard. Pulmonary:     Effort: Pulmonary effort is normal.     Breath sounds: Normal breath sounds.  Musculoskeletal:     Right lower leg: No edema.     Left lower leg: No edema.  Skin:    General: Skin is warm and dry.  Neurological:     Mental Status: He is alert.  Psychiatric:        Thought Content: Thought content normal.        Judgment: Judgment normal.    CBC    Component Value Date/Time   WBC 5.8 09/29/2022 1648   RBC 4.99 09/29/2022 1648   HGB 15.3 09/29/2022 1648   HCT 45.1 09/29/2022 1648   PLT 266.0 09/29/2022 1648   MCV 90.3 09/29/2022 1648   MCH 29.6 05/21/2022 2108   MCHC 33.9 09/29/2022 1648   RDW 14.4 09/29/2022 1648   LYMPHSABS 1.5 09/29/2022 1648   MONOABS 0.6 09/29/2022 1648   EOSABS 0.0 09/29/2022 1648   BASOSABS 0.0 09/29/2022 1648      Latest Ref Rng & Units 05/23/2022    6:40 AM 05/21/2022    9:08 PM 04/25/2022    3:44 AM  BMP  Glucose 70 - 99 mg/dL 896  87  854   BUN 6 - 20 mg/dL 15  10  11    Creatinine 0.61 - 1.24 mg/dL 8.93  8.88  9.16   Sodium 135 - 145 mmol/L 139  137  138   Potassium 3.5 - 5.1  mmol/L 3.7  4.2  5.0   Chloride 98 - 111 mmol/L 105  105  109   CO2 22 - 32 mmol/L 25  23  22    Calcium 8.9 - 10.3 mg/dL 9.1  9.8  9.4    Chest imaging: CXR 05/21/22 The heart size and mediastinal contours are within normal limits. Both lungs are clear. The visualized skeletal structures are unremarkable.  PFT:    Latest Ref Rng & Units 12/07/2022    1:57 PM  PFT Results  FVC-Pre L 4.34   FVC-Predicted Pre % 84   FVC-Post L 4.47   FVC-Predicted Post % 86   Pre FEV1/FVC % % 73   Post FEV1/FCV % % 53   FEV1-Pre L 3.15   FEV1-Predicted Pre % 74   FEV1-Post L 2.36   DLCO uncorrected ml/min/mmHg 37.60   DLCO UNC% % 122   DLCO corrected ml/min/mmHg 37.60   DLCO COR %Predicted % 122   DLVA Predicted % 125   TLC L 6.50   TLC % Predicted % 100   RV % Predicted % 146    Labs:  Path:  Echo:  Heart Catheterization:  Assessment & Plan:   Moderate persistent steroid-dependent asthma with acute exacerbation - Plan: budesonide-glycopyrrolate-formoterol  (BREZTRI  AEROSPHERE) 160-9-4.8 MCG/ACT AERO inhaler, montelukast  (SINGULAIR ) 10 MG tablet, Ambulatory Referral for DME  Snoring - Plan: Home sleep test  Discussion: Tommy Russell is a 33 year old male, former smoker who returns to pulmonary clinic for asthma.   He has moderate persistent asthma with steroid dependence as he has frequent flares in his breathing.   Start breztri  inhaler 2 puffs twice daily. Continue to use albuterol  inhaler  as needed.   We will plan to start dupixent for moderate persistent asthma with steroid dependence.   He is to continue zyrtec and montelukast  for allergies. Recommend as needed flonase for nasal/sinus congestion.  He is to continue pantoprazole  20mg  daily for reflux.   We will schedule him for a home sleep study for his symptoms of fatigue, snoring and headaches.   Follow up in 3 months  Dorn Chill, MD [facility] Office: [phone]   Current Outpatient  Medications:    budesonide-glycopyrrolate-formoterol  (BREZTRI  AEROSPHERE) 160-9-4.8 MCG/ACT AERO inhaler, Inhale 2 puffs into the lungs in the morning and at bedtime., Disp: 11 each, Rfl: 11   budesonide-glycopyrrolate-formoterol  (BREZTRI  AEROSPHERE) 160-9-4.8 MCG/ACT AERO inhaler, 4 samples, Disp: , Rfl:    albuterol  (VENTOLIN  HFA) 108 (90 Base) MCG/ACT inhaler, Inhale 2 puffs into the lungs every 6 (six) hours as needed for wheezing or shortness of breath., Disp: 8 g, Rfl: 11   azithromycin  (ZITHROMAX ) 250 MG tablet, Take as directed, Disp: 6 tablet, Rfl: 0   ipratropium-albuterol  (DUONEB) 0.5-2.5 (3) MG/3ML SOLN, Take 3 mLs by nebulization every 4 (four) hours., Disp: 360 mL, Rfl: 6   montelukast  (SINGULAIR ) 10 MG tablet, Take 1 tablet (10 mg total) by mouth at bedtime., Disp: 90 tablet, Rfl: 3   Multiple Vitamin (MULTIVITAMIN) capsule, Take 1 capsule by mouth daily., Disp: , Rfl:    pantoprazole  (PROTONIX ) 20 MG tablet, Take 1 tablet (20 mg total) by mouth daily., Disp: 30 tablet, Rfl: 5   predniSONE  (DELTASONE ) 10 MG tablet, Take 4 tablets (40 mg total) by mouth daily with breakfast for 3 days, THEN 3 tablets (30 mg total) daily with breakfast for 3 days, THEN 2 tablets (20 mg total) daily with breakfast for 3 days, THEN 1 tablet (10 mg total) daily with breakfast for 3 days., Disp: 30 tablet, Rfl: 0   ZYRTEC ALLERGY 10 MG tablet, Take 10 mg by mouth daily., Disp: , Rfl:

## 2024-02-07 NOTE — Patient Instructions (Addendum)
 Try breztri  inhaler 2 puffs twice daily - rinse mouth out after each use  Use albuterol  inhaler or nebulizer treatments every 4-6 hours as needed  We will order you a new nebulizer machine and supplies  We will schedule you for a home sleep study to evaluate your headaches, fatigue, and snoring  We will work on getting you scheduled for dupixent injections for your asthma  Continue singulair  daily  Follow up in 3 months

## 2024-02-07 NOTE — Telephone Encounter (Signed)
 Dupixent paperwork sent to pharmacy

## 2024-02-08 ENCOUNTER — Telehealth: Payer: Self-pay | Admitting: Pharmacist

## 2024-02-08 NOTE — Telephone Encounter (Signed)
 Received Dupixent new start paperwork  Submitted a Prior Authorization request to [facility] for DUPIXENT via CoverMyMeds. Will update once we receive a response.  Key: [id number]

## 2024-02-10 ENCOUNTER — Other Ambulatory Visit (HOSPITAL_COMMUNITY): Payer: Self-pay

## 2024-02-10 NOTE — Telephone Encounter (Signed)
 Received notification from [facility] regarding a prior authorization for DUPIXENT. Authorization has been APPROVED from 02/09/2024 to 02/07/2025. Approval letter sent to scan center.  Per test claim, copay for 28 days supply is $1387.45  Submitted Patient Assistance Application to [employer] for DUPIXENT along with provider portion, patient portion, PA, medication list, insurance card copy. Will update patient when we receive a response.  Phone #: [phone] Fax #: [phone]  MyChart message scheduled to send to patient on 02/13/24 in regards to next steps with expressing financial hardship to be screened for patient assistance program  Sherry Pennant, PharmD, MPH, BCPS, CPP Clinical Pharmacist (Rheumatology and Pulmonology)

## 2024-02-20 NOTE — Telephone Encounter (Signed)
 Called patient and discussed process for DPX patient assistance. He will call company - recommended he call as soon as he can to get process started. He will check MyChart message that I previously sent  Sherry Pennant, PharmD, MPH, BCPS, CPP Clinical Pharmacist (Rheumatology and Pulmonology)

## 2024-02-21 ENCOUNTER — Telehealth: Payer: Self-pay

## 2024-02-21 ENCOUNTER — Other Ambulatory Visit: Payer: Self-pay

## 2024-02-21 ENCOUNTER — Other Ambulatory Visit: Payer: Self-pay | Admitting: Internal Medicine

## 2024-02-21 MED ORDER — IBUPROFEN 600 MG PO TABS: 600.0000 mg | ORAL_TABLET | Freq: Three times a day (TID) | ORAL | 0 refills | Status: AC | PRN

## 2024-02-21 NOTE — Telephone Encounter (Signed)
 Pt's wife called requesting if Dr. Buckley would please refill the pt's Ibuprofen .  Stated this nurse does not see where Ibuprofen  was prescribed on pt's medication list.  Pt's wife stated it was last prescribed when the pt had surgery a while back and they wanted to know if Dr. Buckley would write another prescription for the medication.  Informed pt's wife that she had reached Dr. Demetra office but this nurse would transfer her to Dr. Eward nurse.  Transferred call but also sent a message to Dr. Buckley and his Team in case pt's wife did not leave a message.

## 2024-02-22 ENCOUNTER — Encounter

## 2024-02-22 DIAGNOSIS — G4730 Sleep apnea, unspecified: Secondary | ICD-10-CM | POA: Diagnosis not present

## 2024-02-22 DIAGNOSIS — R0683 Snoring: Secondary | ICD-10-CM

## 2024-03-02 ENCOUNTER — Telehealth: Payer: Self-pay | Admitting: Pulmonary Disease

## 2024-03-02 DIAGNOSIS — G4733 Obstructive sleep apnea (adult) (pediatric): Secondary | ICD-10-CM | POA: Diagnosis not present

## 2024-03-02 NOTE — Telephone Encounter (Signed)
 ATC X1. LMTCB

## 2024-03-02 NOTE — Telephone Encounter (Signed)
 Call patient  Sleep study result  Date of study: 02/22/2024  Impression: Moderate obstructive sleep apnea with moderate oxygen desaturations AHI of 16.9 with oxygen nadir of 83%  Recommendation: DME referral  Recommend CPAP therapy for moderate obstructive sleep apnea  Auto titrating CPAP with pressure settings of 5-15 will be appropriate, with heated humidification with patient's mask of choice.  Encourage weight loss measures  Follow-up in the office 4 to 6 weeks following initiation of treatment

## 2024-03-09 ENCOUNTER — Telehealth: Payer: Self-pay

## 2024-03-09 DIAGNOSIS — G4733 Obstructive sleep apnea (adult) (pediatric): Secondary | ICD-10-CM

## 2024-03-09 NOTE — Telephone Encounter (Signed)
 Pt returned our call. I informed pt of Dr Cathye note for the sleep study results. Pt agreed with starting CPAP therapy and will call our office to make the 6 week appointment with Dr Neda once he receives the machine. CPAP order has been placed. NFN for this.  Pt also had questions regarding his Dupixent. I will route to the pharmacy team to call and advise the pt's questions for this. NFN

## 2024-03-09 NOTE — Telephone Encounter (Signed)
 Called the pt and there was no answer- LMTCB and will close encounter per protocol. Sending notification through mychart as well.

## 2024-03-13 NOTE — Telephone Encounter (Signed)
 Called and spoke to pt, updates provided in ongoing Dupixent BIV encounter. Nothing further needed at this time.

## 2024-03-13 NOTE — Telephone Encounter (Signed)
 Per Selinda (Dupixent  FRM), pt has been approved for PAP therapy. We have not yet received an approval letter at this time. Per 8/8 telephone encounter pt stated he had some questions regarding the medication.   I contacted pt, he states that he should be receiving his first shipment today (8/12) and was curious about the next steps. I advised him to go ahead and put it in the fridge once he receives it and that the clinic pharmacist would be reaching out to schedule him for a new start visit. He verbalized understanding.

## 2024-03-14 NOTE — Telephone Encounter (Signed)
 ATC patient to schedule new start visit. Unable to reach patient. LVM at [phone] with contact number to return call.   Tommy Russell, PharmD, BCPS Clinical Pharmacist  [facility]

## 2024-03-19 NOTE — Telephone Encounter (Signed)
 Pt returned call. Scheduled for new start DPX on 03/29/24.

## 2024-03-28 NOTE — Progress Notes (Unsigned)
 HPI Patient presents today, accompanied by spouse, to [facility] to see pharmacy team for Dupixent  new start.  Patient has steroid-dependent asthma. Last seen by Dr. Kara on 02/07/24.   Available prescription dispense history demonstrates 3 courses of prednisone  in the last year.   Respiratory Medications - Breztri  160-9-4.8 MCG/ACT (Inhale 2 puffs twice daily),  - Albuterol  inhaler or nebulizer treatments every 4-6 hours as needed - Singulair  10mg  daily   Patient reports no known adherence challenges  OBJECTIVE Allergies  Allergen Reactions   Iodinated Contrast Media Hives   Pineapple Hives, Itching and Swelling   Strawberry (Diagnostic) Hives, Itching and Swelling    Outpatient Encounter Medications as of 03/29/2024  Medication Sig   albuterol  (VENTOLIN  HFA) 108 (90 Base) MCG/ACT inhaler Inhale 2 puffs into the lungs every 6 (six) hours as needed for wheezing or shortness of breath.   azithromycin  (ZITHROMAX ) 250 MG tablet Take as directed   budesonide-glycopyrrolate-formoterol  (BREZTRI  AEROSPHERE) 160-9-4.8 MCG/ACT AERO inhaler Inhale 2 puffs into the lungs in the morning and at bedtime.   budesonide-glycopyrrolate-formoterol  (BREZTRI  AEROSPHERE) 160-9-4.8 MCG/ACT AERO inhaler 4 samples   ibuprofen  (ADVIL ) 600 MG tablet Take 1 tablet (600 mg total) by mouth every 8 (eight) hours as needed for mild pain (pain score 1-3) or headache.   ipratropium-albuterol  (DUONEB) 0.5-2.5 (3) MG/3ML SOLN Take 3 mLs by nebulization every 4 (four) hours.   montelukast  (SINGULAIR ) 10 MG tablet Take 1 tablet (10 mg total) by mouth at bedtime.   Multiple Vitamin (MULTIVITAMIN) capsule Take 1 capsule by mouth daily.   pantoprazole  (PROTONIX ) 20 MG tablet Take 1 tablet (20 mg total) by mouth daily.   ZYRTEC ALLERGY 10 MG tablet Take 10 mg by mouth daily.   No facility-administered encounter medications on file as of 03/29/2024.     Immunization History  Administered Date(s) Administered    PNEUMOCOCCAL CONJUGATE-20 05/23/2022     PFTs    Latest Ref Rng & Units 12/07/2022    1:57 PM  PFT Results  FVC-Pre L 4.34   FVC-Predicted Pre % 84   FVC-Post L 4.47   FVC-Predicted Post % 86   Pre FEV1/FVC % % 73   Post FEV1/FCV % % 53   FEV1-Pre L 3.15   FEV1-Predicted Pre % 74   FEV1-Post L 2.36   DLCO uncorrected ml/min/mmHg 37.60   DLCO UNC% % 122   DLCO corrected ml/min/mmHg 37.60   DLCO COR %Predicted % 122   DLVA Predicted % 125   TLC L 6.50   TLC % Predicted % 100   RV % Predicted % 146      Eosinophils Most recent blood eosinophil count was 0.0 cells/microL taken on 09/29/2022.   IgE: 975 on 09/29/22  Assessment   Biologics training for dupilumab  (Dupixent )  Goals of therapy: Mechanism: human monoclonal IgG4 antibody that inhibits interleukin-4 and interleukin-13 cytokine-induced responses, including release of proinflammatory cytokines, chemokines, and IgE Reviewed that Dupixent  is add-on medication and patient must continue maintenance inhaler regimen. Response to therapy: may take 4 months to determine efficacy. Discussed that patients generally feel improvement sooner than 4 months.  Side effects: injection site reaction (6-18%), antibody development (5-16%), ophthalmic conjunctivitis (2-16%), transient blood eosinophilia (1-2%)  Dose: 600mg  at Week 0 (administered today in clinic) followed by 300mg  every 14 days thereafter  Administration/Storage:  Reviewed administration sites of thigh or abdomen (at least 2-3 inches away from abdomen). Reviewed the upper arm is only appropriate if caregiver is administering injection  Do  not shake pen/syringe as this could lead to product foaming or precipitation. Do not use if solution is discolored or contains particulate matter or if window on prefilled pen is yellow (indicates pen has been used).  Reviewed storage of medication in refrigerator. Reviewed that Dupixent  can be stored at room temperature in unopened  carton for up to 14 days.  Access: Approval of Dupixent  through: patient assistance Patient enrolled into copay card program to help with copay assistance.  Patient's spouse administered Dupixent  300mg /43ml x 2 (total dose 600mg ) in left lower abdomen and right lower abdomen using sample  Dupixent  300mg /77mL autoinjector pen NDC: [phone] Lot: [id number] Expiration: 2026-04-01  Patient monitored for 30 minutes for adverse reaction.  Patient tolerated well.  PLAN Continue Dupixent  300mg  every 14 days.  Next dose is due 04/12/24 and every 14 days thereafter. Rx sent to: [facility]: [phone].  Patient provided with pharmacy phone number.  Continue maintenance inhaler regimen of: Breztri  160-9-4.8 MCG/ACT (Inhale 2 puffs twice daily)   All questions encouraged and answered.  Instructed patient to reach out with any further questions or concerns.  Thank you for allowing pharmacy to participate in this patient's care.  This appointment required 45 minutes of patient care (this includes precharting, chart review, review of results, face-to-face care, etc.).

## 2024-03-29 ENCOUNTER — Ambulatory Visit (INDEPENDENT_AMBULATORY_CARE_PROVIDER_SITE_OTHER): Admitting: Pharmacist

## 2024-03-29 DIAGNOSIS — J4540 Moderate persistent asthma, uncomplicated: Secondary | ICD-10-CM | POA: Diagnosis not present

## 2024-03-29 DIAGNOSIS — Z7189 Other specified counseling: Secondary | ICD-10-CM | POA: Diagnosis not present

## 2024-03-29 MED ORDER — DUPIXENT 300 MG/2ML ~~LOC~~ SOAJ: 300.0000 mg | SUBCUTANEOUS | 2 refills | Status: DC

## 2024-03-29 NOTE — Patient Instructions (Signed)
 Your next Dupixent  dose is due on 04/12/24, 04/26/24, and every 14 days thereafter  CONTINUE Breztri  160-9-4.8 MCG/ACT (Inhale 2 puffs twice daily),  - Albuterol  inhaler or nebulizer treatments every 4-6 hours as needed - Singulair  10mg  daily   Your prescription will be shipped from [facility] pharmacy. Their phone number is [phone].  Please call to schedule shipment and confirm address. They will mail your medication to your home.  You will need to be seen by your provider in 3 to 4 months to assess how Dupixent  is working for you. Please keep your follow-up appointment with Dr. Kara scheduled on 05/09/24.   How to manage an injection site reaction: Remember the 5 C's: COUNTER - leave on the counter at least 30 minutes but up to overnight to bring medication to room temperature. This may help prevent stinging COLD - place something cold (like an ice gel pack or cold water bottle) on the injection site just before cleansing with alcohol. This may help reduce pain CLARITIN  - use Claritin  (generic name is loratadine ) for the first two weeks of treatment or the day of, the day before, and the day after injecting. This will help to minimize injection site reactions CORTISONE CREAM - apply if injection site is irritated and itching CALL ME - if injection site reaction is bigger than the size of your fist, looks infected, blisters, or if you develop hives

## 2024-04-24 ENCOUNTER — Ambulatory Visit: Payer: Self-pay

## 2024-04-24 ENCOUNTER — Ambulatory Visit: Payer: Self-pay | Admitting: Pulmonary Disease

## 2024-04-24 DIAGNOSIS — J4541 Moderate persistent asthma with (acute) exacerbation: Secondary | ICD-10-CM

## 2024-04-24 NOTE — Telephone Encounter (Signed)
 Please advise Patient is requesting prednisone  & Z-pak

## 2024-04-24 NOTE — Telephone Encounter (Signed)
 FYI Only or Action Required?: Action required by provider: update on patient condition and medication request.  Patient is followed in Pulmonology for asthma, last seen on 02/07/2024 by Kara Dorn NOVAK, MD.  Called Nurse Triage reporting Cough.  Symptoms began several days ago.  Symptoms are: gradually worsening.  Triage Disposition: See Physician Within 24 Hours  Patient/caregiver understands and will follow disposition?: No, wishes to speak with PCP      Copied from CRM #[id number]. Topic: Clinical - Red Word Triage >> Apr 24, 2024 11:20 AM Isabell A wrote: [facility] that prompted transfer to Nurse Triage: Experiencing SOB, sore throat, cough with mucus, stuffy nose.Patient is requesting prednisone  & Z-pak      Reason for Disposition  [1] Known COPD or other severe lung disease (i.e., bronchiectasis, cystic fibrosis, lung surgery) AND [2] symptoms getting worse (i.e., increased sputum purulence or amount, increased breathing difficulty  Answer Assessment - Initial Assessment Questions Patient reports he is unable to come in to the office for the available appointments and is requesting a prescriptions for a Z-Pak and for Prednisone . Please advise.     1. ONSET: When did the cough begin?      2 days ago  2. SEVERITY: How bad is the cough today?      Moderate  3. SPUTUM: Describe the color of your sputum (e.g., none, dry cough; clear, white, yellow, green)     Yellow and green  4. HEMOPTYSIS: Are you coughing up any blood? If Yes, ask: How much? (e.g., flecks, streaks, tablespoons, etc.)     No 5. DIFFICULTY BREATHING: Are you having difficulty breathing? If Yes, ask: How bad is it? (e.g., mild, moderate, severe)      Mild  6. FEVER: Do you have a fever? If Yes, ask: What is your temperature, how was it measured, and when did it start?     No 7. CARDIAC HISTORY: Do you have any history of heart disease? (e.g., heart attack, congestive heart failure)       No 8. LUNG HISTORY: Do you have any history of lung disease?  (e.g., pulmonary embolus, asthma, emphysema)     Asthma  9. PE RISK FACTORS: Do you have a history of blood clots? (or: recent major surgery, recent prolonged travel, bedridden)     No 10. OTHER SYMPTOMS: Do you have any other symptoms? (e.g., runny nose, wheezing, chest pain)       Sore throat, stuffy nose, chest pain when coughing  Protocols used: Cough - Acute Productive-A-AH

## 2024-04-24 NOTE — Telephone Encounter (Signed)
 Reason for Disposition  [1] Follow-up call from patient regarding patient's clinical status AND [2] information NON-URGENT  Answer Assessment - Initial Assessment Questions 1. REASON FOR CALL or QUESTION: What is your reason for calling today? or How can I best     Patient is following up after being triaged earlier. He is inquiring if provider will be sending medications to pharmacy. Preferred pharmacy is [facility] on [address]. CALLER: Document the source of call. (e.g., laboratory staff, caregiver or patient).     Patient  Protocols used: PCP Call - No Triage-A-AH

## 2024-04-25 MED ORDER — AZITHROMYCIN 250 MG PO TABS: ORAL_TABLET | ORAL | 0 refills | Status: DC

## 2024-04-25 MED ORDER — PREDNISONE 10 MG PO TABS: 40.0000 mg | ORAL_TABLET | Freq: Every day | ORAL | 0 refills | Status: DC

## 2024-04-25 NOTE — Telephone Encounter (Signed)
 Prednisone  and Zpak sent to pharmacy  JD

## 2024-04-25 NOTE — Telephone Encounter (Signed)
 VBU - that Rx is at pharmacy   PT said Thank you

## 2024-04-25 NOTE — Telephone Encounter (Signed)
 See note from today from [place].  Nothing further needed.

## 2024-04-25 NOTE — Addendum Note (Signed)
 Addended by: Nation Cradle on: 04/25/2024 08:36 AM   Modules accepted: Orders

## 2024-05-09 ENCOUNTER — Ambulatory Visit: Admitting: Pulmonary Disease

## 2024-05-09 ENCOUNTER — Encounter: Payer: Self-pay | Admitting: Pulmonary Disease

## 2024-05-09 VITALS — BP 134/87 | HR 76 | Ht 68.0 in | Wt 281.0 lb

## 2024-05-09 DIAGNOSIS — J4540 Moderate persistent asthma, uncomplicated: Secondary | ICD-10-CM | POA: Diagnosis not present

## 2024-05-09 DIAGNOSIS — Z79620 Long term (current) use of immunosuppressive biologic: Secondary | ICD-10-CM | POA: Diagnosis not present

## 2024-05-09 NOTE — Progress Notes (Signed)
 Synopsis: Referred in November 2023 for asthma  Subjective:   PATIENT ID: Tommy Russell GENDER: male DOB: 1991/07/27, MRN: [id number]  HPI  Chief Complaint  Patient presents with   Medical Management of Chronic Issues   Tommy Russell is a 33 year old male, former smoker who returns to pulmonary clinic for asthma.   Initial OV 06/21/22 He was recently admitted for asthma exacerbation 10/21 to 10/22 where he was treated with steroids and nebulizer treatments. He initially required oxygen. He was discharged with steroid taper along with dulera  100-5mcg 2 puffs twice daily, zyrtec, montelukast  and as needed albuterol  inhaler and duoneb nebulizer treatment. He has done well since discharge.  He does have intermittent reflux symptoms and is not currently taking any medications. 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 He has issues with seasonal allergies, more so in the spring/summer time.   He quit smoking 2 months ago, he was smoking a few cigarettes per week. 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 He denies breathing issues during that time. He reports having increased infections since having brain surgery. His paternal grandmother and grandfather had lung cancer. He had second hand smoke exposure from his step father in childhood.   OV 12/07/22 He has been doing well on dulera  100-5mcg 2 puffs twice daily and using albuterol  2 times per week. He would have night time awakenings about 3 times per week. He recently had increased symptoms over the past week where he used his nebs for 1 week straight with improvement over the past couple of days. He is having sinus congestion. Not using nasal sprays.   PFTs show difficulty in reproducibility, but mild obstruction based on flow - volume pattern.   OV 02/07/24 He has been treated for asthma exacerbation 4 times in the past 12 months. He has been  without dulera  inhaler over the past 2 months due to cost. He is feeling better with recent prednisone  taper that he will complete in the next couple of days.   He had elevated IgE level 975 in 09/2022.   He complains of snoring and headaches and also feeling fatigued throughout the day.  OV 05/09/24 He started dupixent  injections 03/29/24.  He is on Dupixent  for asthma management, started a few weeks ago, with no significant change in symptoms. A flare-up occurred approximately one month after the first injection, requiring prednisone . He self-administers Dupixent  injections and is due for the next dose tomorrow. His regimen includes Breztri , two puffs twice daily, and montelukast . He uses a nebulizer and albuterol  inhaler only during flare-ups. Occasional mucus production occurs after medication intake.   Past Medical History:  Diagnosis Date   Asthma    Depression    GAD (generalized anxiety disorder)      No family history on file.   Social History   Socioeconomic History   Marital status: Married    Spouse name: Not on file   Number of children: Not on file   Years of education: Not on file   Highest education level: Not on file  Occupational History   Not on file  Tobacco Use   Smoking status: Former    Current packs/day: 0.00    Average packs/day: 1 pack/day for 15.2 years (15.2 ttl pk-yrs)    Types: Cigarettes    Start date: 03/01/2007    Quit date: 05/02/2022    Years since  quitting: 2.0   Smokeless tobacco: Never  Substance and Sexual Activity   Alcohol use: Yes    Comment: 1-2 drinks 1-2 times per week   Drug use: Never   Sexual activity: Not on file  Other Topics Concern   Not on file  Social History Narrative   Not on file   Social Drivers of Health   Financial Resource Strain: Not on file  Food Insecurity: No Food Insecurity (05/22/2022)   Hunger Vital Sign    Worried About Running Out of Food in the Last Year: Never true    Ran Out of Food in the Last  Year: Never true  Recent Concern: Food Insecurity - Food Insecurity Present (04/25/2022)   Hunger Vital Sign    Worried About Running Out of Food in the Last Year: Never true    Ran Out of Food in the Last Year: Sometimes true  Transportation Needs: No Transportation Needs (05/22/2022)   PRAPARE - Administrator, Civil Service (Medical): No    Lack of Transportation (Non-Medical): No  Physical Activity: Not on file  Stress: Not on file  Social Connections: Not on file  Intimate Partner Violence: Not At Risk (05/22/2022)   Humiliation, Afraid, Rape, and Kick questionnaire    Fear of Current or Ex-Partner: No    Emotionally Abused: No    Physically Abused: No    Sexually Abused: No     Allergies  Allergen Reactions   Iodinated Contrast Media Hives   Pineapple Hives, Itching and Swelling   Strawberry (Diagnostic) Hives, Itching and Swelling     Outpatient Medications Prior to Visit  Medication Sig Dispense Refill   albuterol  (VENTOLIN  HFA) 108 (90 Base) MCG/ACT inhaler Inhale 2 puffs into the lungs every 6 (six) hours as needed for wheezing or shortness of breath. 8 g 11   budesonide-glycopyrrolate-formoterol  (BREZTRI  AEROSPHERE) 160-9-4.8 MCG/ACT AERO inhaler Inhale 2 puffs into the lungs in the morning and at bedtime. 11 each 11   Dupilumab  (DUPIXENT ) 300 MG/2ML SOAJ Inject 300 mg into the skin every 14 (fourteen) days. 4 mL 2   ibuprofen  (ADVIL ) 600 MG tablet Take 1 tablet (600 mg total) by mouth every 8 (eight) hours as needed for mild pain (pain score 1-3) or headache. 30 tablet 0   ipratropium-albuterol  (DUONEB) 0.5-2.5 (3) MG/3ML SOLN Take 3 mLs by nebulization every 4 (four) hours. 360 mL 6   montelukast  (SINGULAIR ) 10 MG tablet Take 1 tablet (10 mg total) by mouth at bedtime. 90 tablet 3   Multiple Vitamin (MULTIVITAMIN) capsule Take 1 capsule by mouth daily.     pantoprazole  (PROTONIX ) 20 MG tablet Take 1 tablet (20 mg total) by mouth daily. 30 tablet 5   ZYRTEC  ALLERGY 10 MG tablet Take 10 mg by mouth daily.     azithromycin  (ZITHROMAX ) 250 MG tablet Take as directed 6 tablet 0   budesonide-glycopyrrolate-formoterol  (BREZTRI  AEROSPHERE) 160-9-4.8 MCG/ACT AERO inhaler 4 samples     predniSONE  (DELTASONE ) 10 MG tablet Take 4 tablets (40 mg total) by mouth daily with breakfast. (Patient not taking: Reported on 05/09/2024) 20 tablet 0   No facility-administered medications prior to visit.   Review of Systems  Constitutional:  Negative for chills, fever, malaise/fatigue and weight loss.  HENT:  Negative for congestion, sinus pain and sore throat.   Eyes: Negative.   Respiratory:  Negative for cough, hemoptysis, sputum production, shortness of breath and wheezing.   Cardiovascular:  Negative for chest pain, palpitations, orthopnea, claudication  and leg swelling.  Gastrointestinal:  Negative for abdominal pain, heartburn, nausea and vomiting.  Genitourinary: Negative.   Musculoskeletal:  Negative for joint pain and myalgias.  Skin:  Negative for rash.  Neurological:  Negative for weakness and headaches.  Endo/Heme/Allergies: Negative.    Objective:   Vitals:   05/09/24 1115  BP: 134/87  Pulse: 76  SpO2: 97%  Weight: 281 lb (127.5 kg)  Height: 5' 8 (1.727 m)     Physical Exam Constitutional:      General: He is not in acute distress.    Appearance: He is obese.  HENT:     Head: Normocephalic and atraumatic.  Eyes:     Conjunctiva/sclera: Conjunctivae normal.  Cardiovascular:     Rate and Rhythm: Normal rate and regular rhythm.     Pulses: Normal pulses.     Heart sounds: Normal heart sounds. No murmur heard. Pulmonary:     Effort: Pulmonary effort is normal.     Breath sounds: Normal breath sounds.  Musculoskeletal:     Right lower leg: No edema.     Left lower leg: No edema.  Neurological:     Mental Status: He is alert.  Psychiatric:        Thought Content: Thought content normal.        Judgment: Judgment normal.    CBC     Component Value Date/Time   WBC 5.8 09/29/2022 1648   RBC 4.99 09/29/2022 1648   HGB 15.3 09/29/2022 1648   HCT 45.1 09/29/2022 1648   PLT 266.0 09/29/2022 1648   MCV 90.3 09/29/2022 1648   MCH 29.6 05/21/2022 2108   MCHC 33.9 09/29/2022 1648   RDW 14.4 09/29/2022 1648   LYMPHSABS 1.5 09/29/2022 1648   MONOABS 0.6 09/29/2022 1648   EOSABS 0.0 09/29/2022 1648   BASOSABS 0.0 09/29/2022 1648      Latest Ref Rng & Units 05/23/2022    6:40 AM 05/21/2022    9:08 PM 04/25/2022    3:44 AM  BMP  Glucose 70 - 99 mg/dL 896  87  854   BUN 6 - 20 mg/dL 15  10  11    Creatinine 0.61 - 1.24 mg/dL 8.93  8.88  9.16   Sodium 135 - 145 mmol/L 139  137  138   Potassium 3.5 - 5.1 mmol/L 3.7  4.2  5.0   Chloride 98 - 111 mmol/L 105  105  109   CO2 22 - 32 mmol/L 25  23  22    Calcium 8.9 - 10.3 mg/dL 9.1  9.8  9.4    Chest imaging: CXR 05/21/22 The heart size and mediastinal contours are within normal limits. Both lungs are clear. The visualized skeletal structures are unremarkable.  PFT:    Latest Ref Rng & Units 12/07/2022    1:57 PM  PFT Results  FVC-Pre L 4.34   FVC-Predicted Pre % 84   FVC-Post L 4.47   FVC-Predicted Post % 86   Pre FEV1/FVC % % 73   Post FEV1/FCV % % 53   FEV1-Pre L 3.15   FEV1-Predicted Pre % 74   FEV1-Post L 2.36   DLCO uncorrected ml/min/mmHg 37.60   DLCO UNC% % 122   DLCO corrected ml/min/mmHg 37.60   DLCO COR %Predicted % 122   DLVA Predicted % 125   TLC L 6.50   TLC % Predicted % 100   RV % Predicted % 146    Labs:  Path:  Echo:  Heart Catheterization:  Assessment & Plan:   Moderate persistent asthma without complication  Long term (current) use of immunosuppressive biologic  Discussion: Tommy Russell is a 33 year old male, former smoker who returns to pulmonary clinic for asthma.   He has moderate persistent asthma with steroid dependence as he has frequent flares in his breathing.   Continue breztri  inhaler 2 puffs twice daily.  Continue to use albuterol  inhaler as needed.   Continue dupixent  therapy, will give for 6 months before considering a different biologic medication.   He is to continue zyrtec and montelukast  for allergies. Recommend as needed flonase for nasal/sinus congestion.  He is to continue pantoprazole  20mg  daily for reflux.   He was diagnosed with moderate obstructive sleep apnea based on home sleep study. CPAP machine and supplies ordered to start auto-titration 5-15cmH2O. Plan is to start therapy when in near future due to financial concerns.   Follow up in 4 months  Dorn Chill, MD [facility] Office: [phone]   Current Outpatient Medications:    albuterol  (VENTOLIN  HFA) 108 (90 Base) MCG/ACT inhaler, Inhale 2 puffs into the lungs every 6 (six) hours as needed for wheezing or shortness of breath., Disp: 8 g, Rfl: 11   budesonide-glycopyrrolate-formoterol  (BREZTRI  AEROSPHERE) 160-9-4.8 MCG/ACT AERO inhaler, Inhale 2 puffs into the lungs in the morning and at bedtime., Disp: 11 each, Rfl: 11   Dupilumab  (DUPIXENT ) 300 MG/2ML SOAJ, Inject 300 mg into the skin every 14 (fourteen) days., Disp: 4 mL, Rfl: 2   ibuprofen  (ADVIL ) 600 MG tablet, Take 1 tablet (600 mg total) by mouth every 8 (eight) hours as needed for mild pain (pain score 1-3) or headache., Disp: 30 tablet, Rfl: 0   ipratropium-albuterol  (DUONEB) 0.5-2.5 (3) MG/3ML SOLN, Take 3 mLs by nebulization every 4 (four) hours., Disp: 360 mL, Rfl: 6   montelukast  (SINGULAIR ) 10 MG tablet, Take 1 tablet (10 mg total) by mouth at bedtime., Disp: 90 tablet, Rfl: 3   Multiple Vitamin (MULTIVITAMIN) capsule, Take 1 capsule by mouth daily., Disp: , Rfl:    pantoprazole  (PROTONIX ) 20 MG tablet, Take 1 tablet (20 mg total) by mouth daily., Disp: 30 tablet, Rfl: 5   ZYRTEC ALLERGY 10 MG tablet, Take 10 mg by mouth daily., Disp: , Rfl:

## 2024-05-09 NOTE — Patient Instructions (Signed)
 Use breztri  inhaler 2 puffs twice daily - rinse mouth out after each use  Use albuterol  inhaler or nebulizer treatments every 4-6 hours as needed  Continue dupixent  injections every 2 weeks  Continue singulair  daily  Follow up in 4 months

## 2024-05-10 ENCOUNTER — Telehealth: Payer: Self-pay

## 2024-05-10 NOTE — Telephone Encounter (Signed)
 Reached out to patient VM/ LM okay per DPR  after speaking to [employer] about patients assistance program if he would be instrest in that I can mail forms to him.

## 2024-05-16 ENCOUNTER — Telehealth: Payer: Self-pay

## 2024-05-16 NOTE — Telephone Encounter (Signed)
 Copied from CRM [id number]. Topic: General - Other >> May 16, 2024 10:37 AM Isabell A wrote: Reason for CRM: Patient would like to come in to pick up patients assistance program forms - requesting call back when its ready.   Callback number: [phone] >> May 16, 2024 10:44 AM Benton KIDD wrote: Patient is calling back because he is requesting that the paperwork be mailed to him  [phone]. Patient is requesting a call back concerning getting the paperwork mailed to him . Wants to make sure its sent to the right address  Mailing address  [address] [place] [address]  This is the address he would like paperwork mailed to      [employer] PAP forms mail to patient    -NFN

## 2024-05-18 ENCOUNTER — Other Ambulatory Visit: Payer: Self-pay | Admitting: Pulmonary Disease

## 2024-05-18 DIAGNOSIS — J4540 Moderate persistent asthma, uncomplicated: Secondary | ICD-10-CM

## 2024-05-18 NOTE — Telephone Encounter (Signed)
 Copied from CRM #[id number]. Topic: Clinical - Medication Refill >> May 18, 2024  4:05 PM Rilla B wrote: Medication: Dupilumab  (DUPIXENT ) 300 MG/2ML SOA  Has the patient contacted their pharmacy? Yes (Agent: If no, request that the patient contact the pharmacy for the refill. If patient does not wish to contact the pharmacy document the reason why and proceed with request.) (Agent: If yes, when and what did the pharmacy advise?)  This is the patient's preferred pharmacy:  [facility] ([facility]) [phone] FAX: [phone]  Is this the correct pharmacy for this prescription? Yes If no, delete pharmacy and type the correct one.   Has the prescription been filled recently? Yes  Is the patient out of the medication? No  Has the patient been seen for an appointment in the last year OR does the patient have an upcoming appointment? Yes  Can we respond through MyChart? No  Agent: Please be advised that Rx refills may take up to 3 business days. We ask that you follow-up with your pharmacy.

## 2024-06-06 ENCOUNTER — Encounter: Payer: Self-pay | Admitting: Internal Medicine

## 2024-06-18 ENCOUNTER — Telehealth: Payer: Self-pay

## 2024-06-18 NOTE — Telephone Encounter (Signed)
 Notified the patient mother ETTER Ba) regarding her FMLA forms being completed, faxed,confirmation received. Ms. Orlinda copy was emailed to her upon request.

## 2024-07-27 ENCOUNTER — Other Ambulatory Visit: Payer: Self-pay | Admitting: Pulmonary Disease

## 2024-07-27 DIAGNOSIS — J4540 Moderate persistent asthma, uncomplicated: Secondary | ICD-10-CM

## 2024-07-27 NOTE — Telephone Encounter (Signed)
 Copied from CRM #[id number]. Topic: Clinical - Medication Refill >> Jul 27, 2024 12:34 PM Leila C wrote: Most Recent Pulmonary Care Visit:  Provider: Dr. Dorn Chill  Department: [facility] Date: 05/09/24 Medication(s): Dupilumab  (DUPIXENT ) 300 MG/2ML SOAJ   Has the patient contacted their pharmacy? NoSABRA Hicks from [facility] health pharmacy [phone], escribe # [phone], and fax # [phone] is requesting for refill on Dupilumab  (DUPIXENT ) 300 MG/2ML SOAJ, patient was tranferred over by [facility] pharmacy.  (Agent: If no, request that the patient contact the pharmacy for the refill. If patient does not wish to contact the pharmacy document the reason why and proceed with request.) (Agent: If yes, when and what did the pharmacy advise?)  This is the patient's preferred pharmacy:  Hicks from [facility] pharmacy [phone], escribe # [phone], and fax # [phone]  Is this the correct pharmacy for this prescription? Yes If no, delete pharmacy and type the correct one.   Has the prescription been filled recently? No  Is the patient out of the medication? Yes, last dispense 04/04/24 for 84 days supply and medication refill was due 06/04/24.   Has the patient been seen for an appointment in the last year OR does the patient have an upcoming appointment? Yes  Can we respond through MyChart?   Agent: Please be advised that Rx refills may take up to 3 business days. We ask that you follow-up with your pharmacy.

## 2024-07-30 MED ORDER — DUPIXENT 300 MG/2ML ~~LOC~~ SOAJ: 300.0000 mg | SUBCUTANEOUS | 2 refills | Status: AC

## 2024-07-30 NOTE — Telephone Encounter (Signed)
 Refill sent for DUPIXENT  to [facility] ([facility]): [phone]  Dose: 300mg  [place] every 14 days  Last OV: 05/09/24 Provider: Dr. Kara  Next OV: February 2026, not yet scheduled  Tommy Russell, PharmD, BCPS Clinical Pharmacist  [facility]

## 2024-10-08 ENCOUNTER — Inpatient Hospital Stay: Admitting: Internal Medicine
